# Patient Record
Sex: Female | Born: 2000 | Race: Black or African American | Hispanic: No | Marital: Single | Smoking: Former smoker
Health system: Southern US, Community
[De-identification: ages and names within clinical notes are randomized; demographics above are authoritative.]

## PROBLEM LIST (undated history)

## (undated) ENCOUNTER — Emergency Department (HOSPITAL_COMMUNITY): Payer: Medicaid Other | Source: Home / Self Care

## (undated) ENCOUNTER — Inpatient Hospital Stay (HOSPITAL_COMMUNITY): Payer: Self-pay

## (undated) DIAGNOSIS — F32A Depression, unspecified: Secondary | ICD-10-CM

## (undated) DIAGNOSIS — E669 Obesity, unspecified: Secondary | ICD-10-CM

## (undated) DIAGNOSIS — I10 Essential (primary) hypertension: Secondary | ICD-10-CM

## (undated) HISTORY — DX: Obesity, unspecified: E66.9

## (undated) HISTORY — DX: Essential (primary) hypertension: I10

## (undated) HISTORY — PX: MYRINGOTOMY: SUR874

## (undated) HISTORY — DX: Depression, unspecified: F32.A

---
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
## 2022-01-08 ENCOUNTER — Encounter: Payer: Self-pay | Admitting: Nurse Practitioner

## 2022-01-08 ENCOUNTER — Ambulatory Visit (INDEPENDENT_AMBULATORY_CARE_PROVIDER_SITE_OTHER): Payer: 59 | Admitting: Nurse Practitioner

## 2022-01-08 ENCOUNTER — Other Ambulatory Visit (HOSPITAL_COMMUNITY)
Admission: RE | Admit: 2022-01-08 | Discharge: 2022-01-08 | Disposition: A | Discharge status: Home / Self Care | Payer: Medicaid Other | Source: Ambulatory Visit | Attending: Nurse Practitioner | Admitting: Nurse Practitioner

## 2022-01-08 VITALS — BP 128/78 | HR 88 | Ht 64.0 in | Wt 310.0 lb

## 2022-01-08 DIAGNOSIS — N3944 Nocturnal enuresis: Secondary | ICD-10-CM | POA: Diagnosis not present

## 2022-01-08 DIAGNOSIS — B372 Candidiasis of skin and nail: Secondary | ICD-10-CM

## 2022-01-08 DIAGNOSIS — N760 Acute vaginitis: Secondary | ICD-10-CM | POA: Diagnosis not present

## 2022-01-08 DIAGNOSIS — Z6841 Body Mass Index (BMI) 40.0 and over, adult: Secondary | ICD-10-CM

## 2022-01-08 HISTORY — DX: Candidiasis of skin and nail: B37.2

## 2022-01-08 MED ORDER — PHENTERMINE HCL 15 MG PO CAPS: 15.0000 mg | ORAL_CAPSULE | ORAL | 0 refills | Status: DC

## 2022-01-08 MED ORDER — DESMOPRESSIN ACETATE 0.2 MG PO TABS: 0.2000 mg | ORAL_TABLET | Freq: Every day | ORAL | 0 refills | Status: DC

## 2022-01-08 MED ORDER — NYSTATIN 100000 UNIT/GM EX POWD: 1.0000 | Freq: Three times a day (TID) | CUTANEOUS | 0 refills | Status: DC

## 2022-01-08 NOTE — Patient Instructions (Addendum)
Please apply nystatin powder 3 times daily to the areas under your breasts.  Keep site clean and dry always  Please take desmopressin 0.2 mg once daily at bedtime for nocturnal enuresis.  Limit fluid intake to 1 hour before taking the medication.    Please take phentermine 15 mg by mouth daily for weight loss management  It is important that you exercise regularly at least 30 minutes 5 times a week.  Think about what you will eat, plan ahead. Choose " clean, green, fresh or frozen" over canned, processed or packaged foods which are more sugary, salty and fatty. 70 to 75% of food eaten should be vegetables and fruit. Three meals at set times with snacks allowed between meals, but they must be fruit or vegetables. Aim to eat over a 12 hour period , example 7 am to 7 pm, and STOP after  your last meal of the day. Drink water,generally about 64 ounces per day, no other drink is as healthy. Fruit juice is best enjoyed in a healthy way, by EATING the fruit.  Thanks for choosing [facility], we consider it a privelige to serve you.

## 2022-01-08 NOTE — Assessment & Plan Note (Signed)
Wt Readings from Last 3 Encounters:  01/08/22 (!) 310 lb (140.6 kg) (>99 %, Z= 2.87)*  10/28/21 (!) 320 lb (145.2 kg) (>99 %, Z= 2.90)*  09/16/21 (!) 321 lb (145.6 kg) (>99 %, Z= 2.89)*   * Growth percentiles are based on CDC (Girls, 2-20 Years) data.  She does a lot of walking on the job interested in starting weight loss medication. Start phentermine 15 mg daily Patient counseled on low-carb diet patient encouraged to engage in regular vigorous exercises at least 150 minutes weekly importance of portion control discussed. Side effect of phentermine including palpitation hypertension discussed with patient. Follow-up in 4 weeks

## 2022-01-08 NOTE — Assessment & Plan Note (Signed)
Start desmopressin 0.2 mg daily at bedtime Side effects of medications reviewed with patient Patient encouraged to set up alarms to wake up at night to use the bathroom and avoid drinking water late in the night

## 2022-01-08 NOTE — Assessment & Plan Note (Signed)
Urine cytology ordered

## 2022-01-08 NOTE — Progress Notes (Signed)
   Sonya Collins     MRN: [id number]      DOB: Jul 16, 2000   HPI [name] with past medical history of obesity, nocturnal enuresis, anxiety and depression is here for follow-up for nocturnal enuresis.  Patient stated that she has stopped drinking lots of water at night but she still continues to wet the bed.    Vaginitis.  Patient complains of whitish vaginal discharge with itching that occurs each time after her monthly.  This she takes Azo as needed.  She denies fever, chills, abdominal pain, malaise.  She is currently not sexually active.   Candidiasis intertrigo has itchy rashes under her breast since the poast one month, site is red and burning. Marland Kitchen     ROS Denies recent fever or chills. Denies sinus pressure, nasal congestion, ear pain or sore throat. Denies chest congestion, productive cough or wheezing. Denies chest pains, palpitations and leg swelling Denies abdominal pain, nausea, vomiting,diarrhea or constipation.   Denies dysuria, frequency, hesitancy or incontinence. Denies joint pain, swelling and limitation in mobility. Denies headaches, seizures, numbness, or tingling. Denies depression, anxiety or insomnia.    PE  BP 128/78 (BP Location: Right Arm, Patient Position: Sitting, Cuff Size: Large)   Pulse 88   Ht 5\' 4"  (1.626 m)   Wt (!) 310 lb (140.6 kg)   LMP 01/03/2022 (Exact Date)   SpO2 97%   BMI 53.21 kg/m   Patient alert and oriented and in no cardiopulmonary distress.  Chest: Clear to auscultation bilaterally.  CVS: S1, S2 no murmurs, no S3.Regular rate.  ABD: Soft non tender.   Ext: No edema  MS: Adequate ROM spine, shoulders, hips and knees.  Skin: Skin under bilateral breast moist and red.  No drainage or swelling noted  Psych: Good eye contact, normal affect. Memory intact not anxious or depressed appearing.  CNS: CN 2-12 intact, power,  normal throughout.no focal deficits noted.   Assessment & Plan  Obesity Wt Readings from Last 3  Encounters:  01/08/22 (!) 310 lb (140.6 kg) (>99 %, Z= 2.87)*  10/28/21 (!) 320 lb (145.2 kg) (>99 %, Z= 2.90)*  09/16/21 (!) 321 lb (145.6 kg) (>99 %, Z= 2.89)*   * Growth percentiles are based on CDC (Girls, 2-20 Years) data.  She does a lot of walking on the job interested in starting weight loss medication. Start phentermine 15 mg daily Patient counseled on low-carb diet patient encouraged to engage in regular vigorous exercises at least 150 minutes weekly importance of portion control discussed. Side effect of phentermine including palpitation hypertension discussed with patient. Follow-up in 4 weeks  Nocturnal enuresis Start desmopressin 0.2 mg daily at bedtime Side effects of medications reviewed with patient Patient encouraged to set up alarms to wake up at night to use the bathroom and avoid drinking water late in the night  Candidiasis, intertrigo Keep areas under bilateral breast clean and dry Apply nystatin powder 3 times daily  Acute vaginitis Urine cytology ordered

## 2022-01-08 NOTE — Assessment & Plan Note (Signed)
Keep areas under bilateral breast clean and dry Apply nystatin powder 3 times daily

## 2022-01-12 LAB — URINE CYTOLOGY ANCILLARY ONLY
Candida Urine: NEGATIVE
Chlamydia: NEGATIVE
Comment: NEGATIVE
Comment: NEGATIVE
Comment: NORMAL
Neisseria Gonorrhea: NEGATIVE
Trichomonas: POSITIVE — AB

## 2022-01-13 ENCOUNTER — Other Ambulatory Visit: Payer: Self-pay | Admitting: Nurse Practitioner

## 2022-01-13 DIAGNOSIS — A599 Trichomoniasis, unspecified: Secondary | ICD-10-CM

## 2022-01-13 MED ORDER — METRONIDAZOLE 500 MG PO TABS
500.0000 mg | ORAL_TABLET | Freq: Two times a day (BID) | ORAL | 0 refills | Status: AC
Start: 2022-01-13 — End: 2022-01-20

## 2022-01-13 NOTE — Progress Notes (Signed)
Has trichomonas infection patient should take flagyl  500 mg twice daily for 7 days.  Patient should not consume alcohol during the course of treatment and for 24 hours after the last dose.    Patient should advise her sexual partners to also get tested and treated

## 2022-01-30 ENCOUNTER — Emergency Department (HOSPITAL_COMMUNITY)
Admission: EM | Admit: 2022-01-30 | Discharge: 2022-01-30 | Disposition: A | Discharge status: Home / Self Care | Payer: Medicaid Other | Attending: Emergency Medicine | Admitting: Emergency Medicine

## 2022-01-30 ENCOUNTER — Encounter (HOSPITAL_COMMUNITY): Payer: Self-pay | Admitting: Emergency Medicine

## 2022-01-30 ENCOUNTER — Other Ambulatory Visit: Payer: Self-pay

## 2022-01-30 DIAGNOSIS — Z3200 Encounter for pregnancy test, result unknown: Secondary | ICD-10-CM | POA: Diagnosis present

## 2022-01-30 DIAGNOSIS — Z3202 Encounter for pregnancy test, result negative: Secondary | ICD-10-CM | POA: Diagnosis not present

## 2022-01-30 LAB — POC URINE PREG, ED: Preg Test, Ur: NEGATIVE

## 2022-01-30 NOTE — ED Triage Notes (Signed)
Pt presents for pregnancy test, took home test, positive test?

## 2022-01-30 NOTE — Discharge Instructions (Addendum)
Your pregnancy test today is negative. Recommend repeat home test in 1 week if you have not started your period. If your test is positive, follow up with OB.

## 2022-01-30 NOTE — ED Provider Notes (Signed)
[facility] EMERGENCY DEPARTMENT Provider Note   CSN: [id number] Arrival date & time: 01/30/22  1435     History  Chief Complaint  Patient presents with   Possible Pregnancy    Sonya Collins is a 21 y.o. female.  21 year old female presents with request for pregnancy test.  Reports LMP 12/31/2021, has had several home positive tests and would like confirmation.  She denies abdominal pain, vaginal bleeding.  No other complaints or concerns.       Home Medications Prior to Admission medications   Medication Sig Start Date End Date Taking? Authorizing Provider  desmopressin (DDAVP) 0.2 MG tablet Take 1 tablet (0.2 mg total) by mouth at bedtime. 01/08/22   Donell Beers, FNP  FLUoxetine (PROZAC) 20 MG capsule Take 1 capsule (20 mg total) by mouth daily. Patient not taking: Reported on 01/08/2022 11/18/21   Donell Beers, FNP  Iron, Ferrous Sulfate, 325 (65 Fe) MG TABS Take 325 mg by mouth daily. 10/30/21   Paseda, Baird Kay, FNP  Norethindrone Acetate-Ethinyl Estrad-FE (HAILEY 24 FE) 1-20 MG-MCG(24) tablet Take 1 tablet by mouth daily. Patient not taking: Reported on 09/16/2021 05/06/21   Brock Bad, MD  nystatin (MYCOSTATIN/NYSTOP) powder Apply 1 Application topically 3 (three) times daily. 01/08/22   Donell Beers, FNP  phentermine 15 MG capsule Take 1 capsule (15 mg total) by mouth every morning. 01/08/22   Paseda, Baird Kay, FNP  Vitamin D, Ergocalciferol, (DRISDOL) 1.25 MG (50000 UNIT) CAPS capsule TAKE 1 CAPSULE BY MOUTH EVERY 7 DAYS Patient not taking: Reported on 01/08/2022 11/25/21   Donell Beers, FNP      Allergies    Kiwi extract    Review of Systems   Review of Systems Negative except as per HPI Physical Exam Updated Vital Signs BP 114/67 (BP Location: Right Arm)   Pulse 76   Temp 98 F (36.7 C) (Oral)   Resp 16   Ht 5\' 4"  (1.626 m)   Wt (!) 139.7 kg   LMP 01/03/2022 (Exact Date)   SpO2 100%   BMI 52.87 kg/m  Physical Exam Vitals  and nursing note reviewed.  Constitutional:      General: She is not in acute distress.    Appearance: She is well-developed. She is not diaphoretic.  HENT:     Head: Normocephalic and atraumatic.  Pulmonary:     Effort: Pulmonary effort is normal.  Abdominal:     Palpations: Abdomen is soft.     Tenderness: There is no abdominal tenderness. There is no right CVA tenderness or left CVA tenderness.  Skin:    General: Skin is warm and dry.  Neurological:     Mental Status: She is alert and oriented to person, place, and time.  Psychiatric:        Behavior: Behavior normal.     ED Results / Procedures / Treatments   Labs (all labs ordered are listed, but only abnormal results are displayed) Labs Reviewed  POC URINE PREG, ED - Normal    EKG None  Radiology No results found.  Procedures Procedures    Medications Ordered in ED Medications - No data to display  ED Course/ Medical Decision Making/ A&P                           Medical Decision Making  21 year old female with request for pregnancy test after positive home test, LMP 1 month ago. No complaints  otherwise. Abdomen is soft and non tender. Urine test is negative. Recommend repeat home test in 1 week if she has not started her cycle and if positive, can follow up with OB.         Final Clinical Impression(s) / ED Diagnoses Final diagnoses:  Negative pregnancy test    Rx / DC Orders ED Discharge Orders     None         Jeannie Fend, PA-C 01/30/22 1519    Eber Hong, MD 01/31/22 1116

## 2022-02-06 ENCOUNTER — Encounter: Payer: Self-pay | Admitting: Nurse Practitioner

## 2022-02-06 ENCOUNTER — Ambulatory Visit: Payer: 59 | Admitting: Nurse Practitioner

## 2022-03-21 ENCOUNTER — Emergency Department (HOSPITAL_COMMUNITY)
Admission: EM | Admit: 2022-03-21 | Discharge: 2022-03-21 | Disposition: A | Discharge status: Home / Self Care | Payer: Medicaid Other | Attending: Emergency Medicine | Admitting: Emergency Medicine

## 2022-03-21 ENCOUNTER — Encounter (HOSPITAL_COMMUNITY): Payer: Self-pay

## 2022-03-21 ENCOUNTER — Other Ambulatory Visit: Payer: Self-pay

## 2022-03-21 DIAGNOSIS — Z3202 Encounter for pregnancy test, result negative: Secondary | ICD-10-CM | POA: Diagnosis present

## 2022-03-21 DIAGNOSIS — Z711 Person with feared health complaint in whom no diagnosis is made: Secondary | ICD-10-CM

## 2022-03-21 LAB — URINALYSIS, ROUTINE W REFLEX MICROSCOPIC
Bilirubin Urine: NEGATIVE
Glucose, UA: NEGATIVE mg/dL
Hgb urine dipstick: NEGATIVE
Ketones, ur: NEGATIVE mg/dL
Nitrite: NEGATIVE
Protein, ur: NEGATIVE mg/dL
Specific Gravity, Urine: 1.014 (ref 1.005–1.030)
pH: 6 (ref 5.0–8.0)

## 2022-03-21 LAB — PREGNANCY, URINE: Preg Test, Ur: NEGATIVE

## 2022-03-21 NOTE — ED Provider Notes (Signed)
Emergency Department Provider Note   I have reviewed the triage vital signs and the nursing notes.   HISTORY  Chief Complaint wants pregnancy test via bloodwork   HPI Sonya Collins is a 21 y.o. female presents to the ED with slightly delayed menses, cramping, and urine frequency. She is specifically concerned for pregnancy and is requesting a test. She has taken multiple tests at home which have all been negative. No abdominal pain. No fever or chills. No flank pain. No vaginal bleeding or discharge. She was last tested for STI last month, per patient, and all were negative. She has been sexually active with two partners without condoms. She is not on contraception.    Past Medical History:  Diagnosis Date   Depression    Obesity     Review of Systems  Constitutional: No fever/chills Eyes: No visual changes. ENT: No sore throat. Cardiovascular: Denies chest pain. Respiratory: Denies shortness of breath. Gastrointestinal: No abdominal pain.  No nausea, no vomiting.  No diarrhea.  No constipation. Genitourinary: Negative for dysuria. Positive cramping pain. Positive urine frequency.  Musculoskeletal: Negative for back pain. Skin: Negative for rash. Neurological: Negative for headaches, focal weakness or numbness.  ____________________________________________   PHYSICAL EXAM:  VITAL SIGNS: ED Triage Vitals [03/21/22 2025]  Enc Vitals Group     BP 132/69     Pulse Rate 86     Resp 20     Temp 98.3 F (36.8 C)     Temp src      SpO2 99 %   Constitutional: Alert and oriented. Well appearing and in no acute distress. Eyes: Conjunctivae are normal. Head: Atraumatic. Nose: No congestion/rhinnorhea. Mouth/Throat: Mucous membranes are moist.   Neck: No stridor.   Cardiovascular: Normal rate, regular rhythm. Good peripheral circulation. Grossly normal heart sounds.   Respiratory: Normal respiratory effort.  No retractions. Lungs CTAB. Gastrointestinal: Soft and  nontender. No distention.  Musculoskeletal: No lower extremity tenderness nor edema. No gross deformities of extremities. Neurologic:  Normal speech and language. No gross focal neurologic deficits are appreciated.  Skin:  Skin is warm, dry and intact. No rash noted.  ____________________________________________   LABS (all labs ordered are listed, but only abnormal results are displayed)  Labs Reviewed  URINALYSIS, ROUTINE W REFLEX MICROSCOPIC - Abnormal; Notable for the following components:      Result Value   APPearance HAZY (*)    Leukocytes,Ua TRACE (*)    Bacteria, UA RARE (*)    All other components within normal limits  PREGNANCY, URINE   ____________________________________________   PROCEDURES  Procedure(s) performed:   Procedures  None ____________________________________________   INITIAL IMPRESSION / ASSESSMENT AND PLAN / ED COURSE  Pertinent labs & imaging results that were available during my care of the patient were reviewed by me and considered in my medical decision making (see chart for details).   This patient is Presenting for Evaluation of cramping pain, which does require a range of treatment options, and is a complaint that involves a high risk of morbidity and mortality.  The Differential Diagnoses includes but is not exclusive to ectopic pregnancy, ovarian cyst, ovarian torsion, acute appendicitis, urinary tract infection, endometriosis, bowel obstruction, hernia, colitis, renal colic, gastroenteritis, volvulus etc.   I decided to review pertinent External Data, and in summary patient seen in this ED on 8/4 with similar presentation.    Clinical Laboratory Tests Ordered, included urine pregnancy which is negative. UA without clear UTI.   Social Determinants of Health  Risk patient is a smoker.   Medical Decision Making: Summary:  Patient presents to the with concern for pregnancy. She was seen last month for similar presentation. Recent STI  testing. No discharge. Doubt PID/TOA based on history/exam. Patient declines pelvic exam. Urine pregnancy negative. Had a in depth conversation with patient regarding OCPs, condom use, etc to prevent pregnancy, if that is her wish. Also discussed STI risk in unprotected sex. Patient not interested in starting OCPs today. Gave contact information on AVS for local OB/Gyn practice for follow up to discuss contraception options vs preparing to conceive, if that is her wish.   Disposition: discharge  ____________________________________________  FINAL CLINICAL IMPRESSION(S) / ED DIAGNOSES  Final diagnoses:  Concern about current pregnancy without diagnosis    Note:  This document was prepared using Dragon voice recognition software and may include unintentional dictation errors.  Nanda Quinton, MD, [facility] Emergency Medicine    Randeep Biondolillo, Wonda Olds, MD 03/22/22 [phone]

## 2022-03-21 NOTE — ED Notes (Signed)
Patient verbalizes understanding of discharge instructions. Opportunity for questioning and answers were provided. Armband removed by staff, pt discharged from ED. Ambulated out to lobby  

## 2022-03-21 NOTE — Discharge Instructions (Signed)
You were seen in the emergency room today with concern for pregnancy.  Her pregnancy test is negative.  I have listed the name of an OB/GYN on this paperwork to call on Monday for a close follow-up appointment.  They can discuss family-planning options and perform regular GYN screenings.

## 2022-03-21 NOTE — ED Notes (Signed)
ED Provider at bedside. 

## 2022-03-21 NOTE — ED Triage Notes (Signed)
Pt to ED requesting blood work to see if she is pregnant. Pt reports last menstrual cycle was Aug 6-9, pt says she took home urne test earlier this week and it was negative, pt reports last intercourse was sept 11 and she didn't use protection. Pt reports nausea and abd cramping for several weeks, frequent urination symptoms started this week.

## 2022-05-08 ENCOUNTER — Encounter: Payer: Self-pay | Admitting: Family Medicine

## 2022-05-08 ENCOUNTER — Telehealth: Payer: Self-pay | Admitting: Family Medicine

## 2022-05-08 ENCOUNTER — Ambulatory Visit (INDEPENDENT_AMBULATORY_CARE_PROVIDER_SITE_OTHER): Payer: Commercial Managed Care - PPO | Admitting: Family Medicine

## 2022-05-08 VITALS — BP 124/86 | HR 88 | Ht 64.0 in | Wt 314.0 lb

## 2022-05-08 DIAGNOSIS — H9209 Otalgia, unspecified ear: Secondary | ICD-10-CM | POA: Insufficient documentation

## 2022-05-08 DIAGNOSIS — H9202 Otalgia, left ear: Secondary | ICD-10-CM

## 2022-05-08 DIAGNOSIS — A749 Chlamydial infection, unspecified: Secondary | ICD-10-CM | POA: Diagnosis not present

## 2022-05-08 DIAGNOSIS — Z113 Encounter for screening for infections with a predominantly sexual mode of transmission: Secondary | ICD-10-CM

## 2022-05-08 MED ORDER — DOXYCYCLINE HYCLATE 100 MG PO TABS: 100.0000 mg | ORAL_TABLET | Freq: Two times a day (BID) | ORAL | 0 refills | Status: DC

## 2022-05-08 MED ORDER — DOXYCYCLINE HYCLATE 100 MG PO TABS: 100.0000 mg | ORAL_TABLET | Freq: Two times a day (BID) | ORAL | 0 refills | Status: AC

## 2022-05-08 NOTE — Addendum Note (Signed)
Addended byGilmore Laroche on: 05/08/2022 05:02 PM   Modules accepted: Orders

## 2022-05-08 NOTE — Assessment & Plan Note (Signed)
Normal tympanic membranes bilaterally No signs of infection were noted on physical examination Denies pain with palpation of the left and right ear Her right ear is impacted; she declines ear irrigation at this time She reports that she is getting over a cold, which is likely causing her ear pain

## 2022-05-08 NOTE — Telephone Encounter (Signed)
Pt is wanting to know why she was given doxycycline?

## 2022-05-08 NOTE — Patient Instructions (Signed)
I appreciate the opportunity to provide care to you today!    Follow up:  3 months  Please pick up your medication at the pharmacy and complete treatment    Please continue to a heart-healthy diet and increase your physical activities. Try to exercise for at least three times a week.      It was a pleasure to see you and I look forward to continuing to work together on your health and well-being. Please do not hesitate to call the office if you need care or have questions about your care.   Have a wonderful day and week. With Gratitude, Gilmore Laroche MSN, FNP-BC

## 2022-05-08 NOTE — Progress Notes (Signed)
Established Patient Office Visit  Subjective:  Patient ID: Sonya Collins, female    DOB: 2001-04-19  Age: 21 y.o. MRN: [id number]  CC:  Chief Complaint  Patient presents with   STD screening    Would like to have std testing.   Ear Pain    Pt report ear fullness since 05/05/2022    HPI Sonya Collins is a 21 y.o. female with past medical history of unprotected sexual intercourse, presents for f/u of  chronic medical conditions.  STD exposure: She reports that her partner tested positive for chlamydia 2 weeks ago, and she would like to be tested today.  She is asymptomatic, denying symptoms of dysuria and abnormal vaginal discharge.  She complains of left ear pain, reporting that her ear has felt full since 05/05/2022.  She denies discharge, odor, and fever.  Past Medical History:  Diagnosis Date   Depression    Obesity     Past Surgical History:  Procedure Laterality Date   MYRINGOTOMY      Family History  Problem Relation Age of Onset   Hypertension Father    Diabetes Father    Schizophrenia Brother    Breast cancer Other    Breast cancer Other     Social History   Socioeconomic History   Marital status: Single    Spouse name: Not on file   Number of children: Not on file   Years of education: Not on file   Highest education level: Not on file  Occupational History   Not on file  Tobacco Use   Smoking status: Every Day    Types: E-cigarettes   Smokeless tobacco: Never  Vaping Use   Vaping Use: Every day  Substance and Sexual Activity   Alcohol use: Not Currently    Comment: on weekends   Drug use: Yes    Types: Marijuana    Comment: daily   Sexual activity: Yes    Partners: Male    Birth control/protection: None  Other Topics Concern   Not on file  Social History Narrative   Lives with her grandparents, works at [employer] office.    Social Determinants of Health   Financial Resource Strain: Not on file  Food Insecurity: Not on file   Transportation Needs: Not on file  Physical Activity: Not on file  Stress: Not on file  Social Connections: Not on file  Intimate Partner Violence: Not on file    Outpatient Medications Prior to Visit  Medication Sig Dispense Refill   desmopressin (DDAVP) 0.2 MG tablet Take 1 tablet (0.2 mg total) by mouth at bedtime. (Patient not taking: Reported on 05/08/2022) 90 tablet 0   FLUoxetine (PROZAC) 20 MG capsule Take 1 capsule (20 mg total) by mouth daily. (Patient not taking: Reported on 01/08/2022) 90 capsule 3   Iron, Ferrous Sulfate, 325 (65 Fe) MG TABS Take 325 mg by mouth daily. (Patient not taking: Reported on 05/08/2022) 90 tablet 0   Norethindrone Acetate-Ethinyl Estrad-FE (HAILEY 24 FE) 1-20 MG-MCG(24) tablet Take 1 tablet by mouth daily. (Patient not taking: Reported on 09/16/2021) 28 tablet 11   nystatin (MYCOSTATIN/NYSTOP) powder Apply 1 Application topically 3 (three) times daily. (Patient not taking: Reported on 05/08/2022) 15 g 0   phentermine 15 MG capsule Take 1 capsule (15 mg total) by mouth every morning. (Patient not taking: Reported on 05/08/2022) 30 capsule 0   Vitamin D, Ergocalciferol, (DRISDOL) 1.25 MG (50000 UNIT) CAPS capsule TAKE 1 CAPSULE BY MOUTH EVERY 7 DAYS (  Patient not taking: Reported on 01/08/2022) 8 capsule 0   No facility-administered medications prior to visit.    Allergies  Allergen Reactions   Kiwi Extract     ROS Review of Systems  Constitutional:  Negative for fatigue and fever.  HENT:  Positive for ear pain. Negative for ear discharge.   Respiratory:  Negative for chest tightness and shortness of breath.   Cardiovascular:  Negative for chest pain and palpitations.  Gastrointestinal:  Negative for diarrhea and vomiting.  Neurological:  Negative for dizziness and headaches.      Objective:    Physical Exam HENT:     Head: Normocephalic.     Right Ear: Hearing, tympanic membrane and ear canal normal. There is impacted cerumen. Tympanic  membrane is not injected, perforated or erythematous.     Left Ear: Hearing, tympanic membrane and ear canal normal. There is no impacted cerumen. Tympanic membrane is not injected, perforated or erythematous.     Nose: No congestion.  Cardiovascular:     Rate and Rhythm: Normal rate and regular rhythm.     Heart sounds: No murmur heard. Pulmonary:     Effort: Pulmonary effort is normal.     Breath sounds: Normal breath sounds.  Musculoskeletal:     Cervical back: No rigidity.  Skin:    Findings: No bruising.  Neurological:     Mental Status: She is alert.     BP 124/86   Pulse 88   Ht _0  (1.626 m)   Wt (!) 314 lb (142.4 kg)   LMP 04/21/2022   SpO2 97%   BMI 53.90 kg/m  Wt Readings from Last 3 Encounters:  05/08/22 (!) 314 lb (142.4 kg) (>99 %, Z= 2.93)*  01/30/22 (!) 308 lb (139.7 kg) (>99 %, Z= 2.87)*  01/08/22 (!) 310 lb (140.6 kg) (>99 %, Z= 2.87)*   * Growth percentiles are based on CDC (Girls, 2-20 Years) data.    Lab Results  Component Value Date   TSH 2.710 09/16/2021   Lab Results  Component Value Date   WBC 4.7 09/16/2021   HGB 12.1 09/16/2021   HCT 38.1 09/16/2021   MCV 78 (L) 09/16/2021   PLT 363 09/16/2021   Lab Results  Component Value Date   NA 140 09/16/2021   K 4.2 09/16/2021   CO2 24 09/16/2021   GLUCOSE 86 09/16/2021   BUN 8 09/16/2021   CREATININE 0.59 09/16/2021   BILITOT 0.4 09/16/2021   ALKPHOS 78 09/16/2021   AST 15 09/16/2021   ALT 16 09/16/2021   PROT 6.6 09/16/2021   ALBUMIN 3.7 (L) 09/16/2021   CALCIUM 9.4 09/16/2021   EGFR 133 09/16/2021   Lab Results  Component Value Date   CHOL 165 09/16/2021   Lab Results  Component Value Date   HDL 52 09/16/2021   Lab Results  Component Value Date   LDLCALC 88 09/16/2021   Lab Results  Component Value Date   TRIG 144 (H) 09/16/2021   Lab Results  Component Value Date   CHOLHDL 3.2 09/16/2021   Lab Results  Component Value Date   HGBA1C 5.7 (H) 09/16/2021       Assessment & Plan:   Problem List Items Addressed This Visit       Other   Screening examination for STD (sexually transmitted disease) - Primary    Given her recent exposure, the patient will be treated prophylactically with doxycycline 100 mg twice daily for 5 days for chlamydia Encourage the  patient to complete the full course of antibiotic Pending NuSwab      Relevant Orders   NuSwab Vaginitis Plus (VG+)   Ear pain    Normal tympanic membranes bilaterally No signs of infection were noted on physical examination Denies pain with palpation of the left and right ear Her right ear is impacted; she declines ear irrigation at this time She reports that she is getting over a cold, which is likely causing her ear pain      Other Visit Diagnoses     Chlamydia       Relevant Medications   doxycycline (VIBRA-TABS) 100 MG tablet       Meds ordered this encounter  Medications   doxycycline (VIBRA-TABS) 100 MG tablet    Sig: Take 1 tablet (100 mg total) by mouth 2 (two) times daily for 5 days.    Dispense:  10 tablet    Refill:  0    Follow-up: Return in about 3 months (around 08/08/2022).    Alvira Monday, FNP

## 2022-05-08 NOTE — Assessment & Plan Note (Addendum)
Given her recent exposure, the patient will be treated prophylactically with doxycycline 100 mg twice daily for 5 days for chlamydia Encourage the patient to complete the full course of antibiotic Pending NuSwab

## 2022-05-08 NOTE — Telephone Encounter (Signed)
Pt informed

## 2022-05-13 NOTE — Progress Notes (Signed)
Please inform the patient that she tested negative for trichomoniasis, gonorrhea, BV and yeast infection

## 2022-05-14 ENCOUNTER — Other Ambulatory Visit: Payer: Self-pay | Admitting: Family Medicine

## 2022-05-14 ENCOUNTER — Telehealth: Payer: Self-pay | Admitting: Family Medicine

## 2022-05-14 LAB — NUSWAB VAGINITIS PLUS (VG+)
Candida albicans, NAA: NEGATIVE
Candida glabrata, NAA: NEGATIVE
Chlamydia trachomatis, NAA: POSITIVE — AB
Neisseria gonorrhoeae, NAA: NEGATIVE
Trich vag by NAA: NEGATIVE

## 2022-05-14 NOTE — Progress Notes (Signed)
Please inform the patient that she tested positive for chlamydia, and to continue her treatment with doxycycline 100 mg twice daily

## 2022-05-14 NOTE — Telephone Encounter (Signed)
Patient sent mycart message for lab results. Patient wants a call.

## 2022-05-14 NOTE — Telephone Encounter (Signed)
Mychart message sent with lab results.

## 2022-05-14 NOTE — Telephone Encounter (Signed)
Pt called no answer no vm

## 2022-05-24 ENCOUNTER — Telehealth: Payer: Medicaid Other | Admitting: Nurse Practitioner

## 2022-05-24 ENCOUNTER — Encounter: Payer: Self-pay | Admitting: Family Medicine

## 2022-05-24 DIAGNOSIS — B3731 Acute candidiasis of vulva and vagina: Secondary | ICD-10-CM | POA: Diagnosis not present

## 2022-05-24 MED ORDER — FLUCONAZOLE 150 MG PO TABS: 150.0000 mg | ORAL_TABLET | Freq: Once | ORAL | 0 refills | Status: AC

## 2022-05-24 NOTE — Progress Notes (Signed)

## 2022-05-24 NOTE — Progress Notes (Signed)
I have spent 5 minutes in review of e-visit questionnaire, review and updating patient chart, medical decision making and response to patient.  ° °Sonya Collins W Kirsty Monjaraz, NP ° °  °

## 2022-05-25 ENCOUNTER — Other Ambulatory Visit: Payer: Self-pay

## 2022-05-25 ENCOUNTER — Other Ambulatory Visit: Payer: Self-pay | Admitting: Family Medicine

## 2022-05-25 DIAGNOSIS — B379 Candidiasis, unspecified: Secondary | ICD-10-CM

## 2022-05-25 DIAGNOSIS — N3944 Nocturnal enuresis: Secondary | ICD-10-CM

## 2022-05-25 MED ORDER — FLUCONAZOLE 150 MG PO TABS: 150.0000 mg | ORAL_TABLET | Freq: Once | ORAL | 0 refills | Status: AC

## 2022-05-25 MED ORDER — DESMOPRESSIN ACETATE 0.2 MG PO TABS: 0.2000 mg | ORAL_TABLET | Freq: Every day | ORAL | 0 refills | Status: DC

## 2022-07-06 ENCOUNTER — Ambulatory Visit (INDEPENDENT_AMBULATORY_CARE_PROVIDER_SITE_OTHER): Payer: Medicaid Other | Admitting: Internal Medicine

## 2022-07-06 ENCOUNTER — Encounter: Payer: Self-pay | Admitting: Internal Medicine

## 2022-07-06 VITALS — BP 112/71 | HR 81 | Resp 16 | Ht 64.0 in | Wt 317.0 lb

## 2022-07-06 DIAGNOSIS — R7303 Prediabetes: Secondary | ICD-10-CM

## 2022-07-06 DIAGNOSIS — D509 Iron deficiency anemia, unspecified: Secondary | ICD-10-CM | POA: Diagnosis not present

## 2022-07-06 DIAGNOSIS — N912 Amenorrhea, unspecified: Secondary | ICD-10-CM

## 2022-07-06 LAB — POCT URINE PREGNANCY: Preg Test, Ur: NEGATIVE

## 2022-07-06 NOTE — Patient Instructions (Addendum)
Thank you, Ms.[name] for allowing [place] to provide your care today.   I have ordered the following labs for you:  Lab Orders         hCG, quantitative, pregnancy         Hemoglobin A1C         Ferritin         CBC with Differential/Platelet       Referrals ordered today:   Referral Orders         Ambulatory referral to diabetic education         Amb Referral To Provider Referral Exercise Program (P.R.E.P)       Reminders: Regular physical activity is one of the most important things you can do for your health. If you're ready to get the immediate benefits of better sleep, reduced anxiety, and lower blood pressure, here are ways to get started. Look for ways to reduce time sitting and increase time moving. For example, make it a tradition to walk before or after dinner. I recommend at least 150 minutes of moderate-intensity physical activity a week for adults. You might split that into 30 minutes, 5 days a week.  I will follow up with results of lab work.      Tamsen Snider, M.D.

## 2022-07-06 NOTE — Progress Notes (Unsigned)
   HPI:[name] is a 22 y.o. female who presents for evaluation of possible pregnancy. For the details of today's visit, please refer to the assessment and plan.  Physical Exam: Vitals:   07/06/22 1100  BP: 112/71  Pulse: 81  Resp: 16  SpO2: 97%  Weight: (!) 317 lb (143.8 kg)  Height: 5\' 4"  (1.626 m)     Physical Exam Constitutional:      General: She is not in acute distress.    Appearance: She is well-developed and well-groomed. She is obese.  Eyes:     General: No scleral icterus.    Conjunctiva/sclera: Conjunctivae normal.  Cardiovascular:     Rate and Rhythm: Normal rate and regular rhythm.     Heart sounds: No murmur heard.    No friction rub. No gallop.  Pulmonary:     Effort: Pulmonary effort is normal.     Breath sounds: No wheezing, rhonchi or rales.  Musculoskeletal:     Right lower leg: No edema.     Left lower leg: No edema.  Skin:    General: Skin is warm and dry.      Assessment & Plan:   Iron deficiency anemia Patient not currently on iron supplement. Hx of IDA, likely secondary to menstrual cycle. - CBC, Ferritin  Addendum: No longer anemic, Ferritin <100. Recommended increasing intake of foods high in iron.   Amenorrhea Patient has regular menstrual cycles and has missed her last menstrual cycle. Her last menstrual cycle was December 2nd. She is not on BC. Had unprotected sex recently and inconclusive home pregnancy test. POC urine pregnancy test negative. Patient request further testing today. - Beta HCG, Quant  Morbid obesity (Sonya Collins) BMI 54.41. Prediabetic.  - Counseled on risk of obesity  - Referral to RD  - Referral To Provider Referral Exercise Program (P.R.E.P)   Prediabetes Borderline prediabetic at last check.  - Hemoglobin A1c - Referral to RD to discuss diet     Lorene Dy, MD

## 2022-07-07 DIAGNOSIS — R7303 Prediabetes: Secondary | ICD-10-CM | POA: Insufficient documentation

## 2022-07-07 DIAGNOSIS — N912 Amenorrhea, unspecified: Secondary | ICD-10-CM | POA: Insufficient documentation

## 2022-07-07 LAB — CBC WITH DIFFERENTIAL/PLATELET
Basophils Absolute: 0 10*3/uL (ref 0.0–0.2)
Basos: 0 %
EOS (ABSOLUTE): 0.1 10*3/uL (ref 0.0–0.4)
Eos: 2 %
Hematocrit: 39.3 % (ref 34.0–46.6)
Hemoglobin: 12.8 g/dL (ref 11.1–15.9)
Immature Grans (Abs): 0 10*3/uL (ref 0.0–0.1)
Immature Granulocytes: 0 %
Lymphocytes Absolute: 1.1 10*3/uL (ref 0.7–3.1)
Lymphs: 31 %
MCH: 26.8 pg (ref 26.6–33.0)
MCHC: 32.6 g/dL (ref 31.5–35.7)
MCV: 82 fL (ref 79–97)
Monocytes Absolute: 0.3 10*3/uL (ref 0.1–0.9)
Monocytes: 8 %
Neutrophils Absolute: 2 10*3/uL (ref 1.4–7.0)
Neutrophils: 59 %
Platelets: 369 10*3/uL (ref 150–450)
RBC: 4.77 x10E6/uL (ref 3.77–5.28)
RDW: 13.2 % (ref 11.7–15.4)
WBC: 3.4 10*3/uL (ref 3.4–10.8)

## 2022-07-07 LAB — BETA HCG QUANT (REF LAB): hCG Quant: <1 m[IU]/mL

## 2022-07-07 LAB — HEMOGLOBIN A1C
Est. average glucose Bld gHb Est-mCnc: 114 mg/dL
Hgb A1c MFr Bld: 5.6 % (ref 4.8–5.6)

## 2022-07-07 LAB — FERRITIN: Ferritin: 28 ng/mL (ref 15–77)

## 2022-07-07 NOTE — Assessment & Plan Note (Addendum)
Patient has regular menstrual cycles and has missed her last menstrual cycle. Her last menstrual cycle was December 2nd. She is not on BC. Had unprotected sex recently and inconclusive home pregnancy test. POC urine pregnancy test negative. Patient request further testing today. - Beta HCG, Quant

## 2022-07-07 NOTE — Assessment & Plan Note (Signed)
BMI 54.41. Prediabetic.  - Counseled on risk of obesity  - Referral to RD  - Referral To Provider Referral Exercise Program (P.R.E.P)

## 2022-07-07 NOTE — Assessment & Plan Note (Signed)
Borderline prediabetic at last check.  - Hemoglobin A1c - Referral to RD to discuss diet

## 2022-07-07 NOTE — Assessment & Plan Note (Signed)
Patient not currently on iron supplement. Hx of IDA, likely secondary to menstrual cycle. - CBC, Ferritin  Addendum: No longer anemic, Ferritin <100. Recommended increasing intake of foods high in iron.

## 2022-08-05 ENCOUNTER — Telehealth: Payer: Self-pay | Admitting: Nutrition

## 2022-08-05 ENCOUNTER — Ambulatory Visit: Payer: Medicaid Other | Admitting: Nutrition

## 2022-08-05 NOTE — Telephone Encounter (Signed)
Vm to call and r/s missed appt.

## 2022-08-14 ENCOUNTER — Ambulatory Visit: Payer: 59 | Admitting: Family Medicine

## 2022-08-17 ENCOUNTER — Encounter: Payer: Self-pay | Admitting: Family Medicine

## 2022-08-18 ENCOUNTER — Ambulatory Visit: Payer: Medicaid Other | Admitting: Nutrition

## 2022-09-16 ENCOUNTER — Ambulatory Visit: Payer: Medicaid Other | Admitting: Nutrition

## 2022-09-17 ENCOUNTER — Ambulatory Visit: Payer: Medicaid Other | Admitting: Nurse Practitioner

## 2022-09-18 ENCOUNTER — Encounter: Payer: Medicaid Other | Admitting: Family Medicine

## 2022-09-29 ENCOUNTER — Encounter: Payer: Self-pay | Admitting: Emergency Medicine

## 2022-09-29 ENCOUNTER — Other Ambulatory Visit: Payer: Self-pay

## 2022-09-29 ENCOUNTER — Ambulatory Visit
Admission: EM | Admit: 2022-09-29 | Discharge: 2022-09-29 | Disposition: A | Discharge status: Home / Self Care | Payer: Medicaid Other | Attending: Family Medicine | Admitting: Family Medicine

## 2022-09-29 DIAGNOSIS — N926 Irregular menstruation, unspecified: Secondary | ICD-10-CM | POA: Diagnosis not present

## 2022-09-29 DIAGNOSIS — R110 Nausea: Secondary | ICD-10-CM

## 2022-09-29 DIAGNOSIS — Z3202 Encounter for pregnancy test, result negative: Secondary | ICD-10-CM

## 2022-09-29 LAB — POCT URINALYSIS DIP (MANUAL ENTRY)
Bilirubin, UA: NEGATIVE
Blood, UA: NEGATIVE
Glucose, UA: NEGATIVE mg/dL
Ketones, POC UA: NEGATIVE mg/dL
Leukocytes, UA: NEGATIVE
Nitrite, UA: NEGATIVE
Protein Ur, POC: NEGATIVE mg/dL
Spec Grav, UA: >=1.03 — AB (ref 1.010–1.025)
Urobilinogen, UA: 2 E.U./dL — AB
pH, UA: 6 (ref 5.0–8.0)

## 2022-09-29 LAB — POCT URINE PREGNANCY: Preg Test, Ur: NEGATIVE

## 2022-09-29 NOTE — ED Provider Notes (Signed)
[facility]    CSN: [id number] Arrival date & time: 09/29/22  1903      History   Chief Complaint Chief Complaint  Patient presents with   Abdominal Pain    HPI Sonya Collins is a 22 y.o. female.   Presenting today with about a week of nausea intermittent throughout the day.  She states she missed her period a week or so ago so took a pregnancy test this morning at home with a faint pink line.  She wanted to double check by having a pregnancy test done here.  Denies vaginal bleeding, vaginal discharge, abdominal pain, flank pain, fever, chills.    Past Medical History:  Diagnosis Date   Depression    Obesity     Patient Active Problem List   Diagnosis Date Noted   Prediabetes 07/07/2022   Amenorrhea 07/07/2022   Screening examination for STD (sexually transmitted disease) 05/08/2022   Ear pain 05/08/2022   Candidiasis, intertrigo 01/08/2022   Acute vaginitis 01/08/2022   Anxiety and depression 10/28/2021   Iron deficiency anemia 10/28/2021   Tremor 10/28/2021   Nocturnal enuresis 10/28/2021   Vapes nicotine containing substance 10/28/2021   Unprotected sexual intercourse 10/28/2021   Intolerance to cold 10/28/2021   Morbid obesity 09/16/2021   Screening due 09/16/2021   Breakthrough bleeding 09/16/2021   Breakthrough bleeding on Nexplanon 02/07/2020   Nexplanon in place 02/06/2020    Past Surgical History:  Procedure Laterality Date   MYRINGOTOMY      OB History     Gravida  0   Para  0   Term  0   Preterm  0   AB  0   Living  0      SAB  0   IAB  0   Ectopic  0   Multiple  0   Live Births  0            Home Medications    Prior to Admission medications   Medication Sig Start Date End Date Taking? Authorizing Provider  desmopressin (DDAVP) 0.2 MG tablet Take 1 tablet (0.2 mg total) by mouth at bedtime. 05/25/22   Alvira Monday, FNP  FLUoxetine (PROZAC) 10 MG tablet Take 10 mg by mouth daily.    [provider]    Family History Family History  Problem Relation Age of Onset   Hypertension Father    Diabetes Father    Schizophrenia Brother    Breast cancer Other    Breast cancer Other     Social History Social History   Tobacco Use   Smoking status: Every Day    Types: E-cigarettes   Smokeless tobacco: Never  Vaping Use   Vaping Use: Every day  Substance Use Topics   Alcohol use: Not Currently    Comment: on weekends   Drug use: Yes    Types: Marijuana    Comment: occ     Allergies   Kiwi extract   Review of Systems Review of Systems Per HPI  Physical Exam Triage Vital Signs ED Triage Vitals  Enc Vitals Group     BP 09/29/22 1922 110/78     Pulse Rate 09/29/22 1922 81     Resp 09/29/22 1922 20     Temp 09/29/22 1922 98.8 F (37.1 C)     Temp Source 09/29/22 1922 Oral     SpO2 09/29/22 1922 94 %     Weight --      Height --  Head Circumference --      Peak Flow --      Pain Score 09/29/22 1918 0     Pain Loc --      Pain Edu? --      Excl. in [place]? --    No data found.  Updated Vital Signs BP 110/78 (BP Location: Right Arm)   Pulse 81   Temp 98.8 F (37.1 C) (Oral)   Resp 20   LMP 08/25/2022 (Approximate)   SpO2 94%   Visual Acuity Right Eye Distance:   Left Eye Distance:   Bilateral Distance:    Right Eye Near:   Left Eye Near:    Bilateral Near:     Physical Exam Vitals and nursing note reviewed.  Constitutional:      Appearance: Normal appearance. She is not ill-appearing.  HENT:     Head: Atraumatic.  Eyes:     Extraocular Movements: Extraocular movements intact.     Conjunctiva/sclera: Conjunctivae normal.  Cardiovascular:     Rate and Rhythm: Normal rate and regular rhythm.     Heart sounds: Normal heart sounds.  Pulmonary:     Effort: Pulmonary effort is normal.     Breath sounds: Normal breath sounds.  Abdominal:     General: Bowel sounds are normal. There is no distension.     Palpations: Abdomen is soft.      Tenderness: There is no abdominal tenderness. There is no guarding.  Genitourinary:    Comments: GU exam deferred Musculoskeletal:        General: Normal range of motion.     Cervical back: Normal range of motion and neck supple.  Skin:    General: Skin is warm and dry.  Neurological:     Mental Status: She is alert and oriented to person, place, and time.  Psychiatric:        Mood and Affect: Mood normal.        Thought Content: Thought content normal.        Judgment: Judgment normal.      UC Treatments / Results  Labs (all labs ordered are listed, but only abnormal results are displayed) Labs Reviewed  POCT URINALYSIS DIP (MANUAL ENTRY) - Abnormal; Notable for the following components:      Result Value   Spec Grav, UA >=1.030 (*)    Urobilinogen, UA 2.0 (*)    All other components within normal limits  POCT URINE PREGNANCY    EKG   Radiology No results found.  Procedures Procedures (including critical care time)  Medications Ordered in UC Medications - No data to display  Initial Impression / Assessment and Plan / UC Course  I have reviewed the triage vital signs and the nursing notes.  Pertinent labs & imaging results that were available during my care of the patient were reviewed by me and considered in my medical decision making (see chart for details).     Vitals and exam reassuring, urine pregnancy negative.  Offered blood work for reassurance but she plans to just take another home test tomorrow to confirm.  OB/GYN resources given.  Declines nausea medication or further evaluation today.  Final Clinical Impressions(s) / UC Diagnoses   Final diagnoses:  Negative pregnancy test  Missed period  Nausea without vomiting     Discharge Instructions      Your pregnancy test was negative today.  I have provided OB/GYN information, you may call and schedule an appointment to discuss your irregular periods and  the symptoms that you are  having.    ED Prescriptions   None    PDMP not reviewed this encounter.   Volney American, [place] 09/29/22 1956

## 2022-09-29 NOTE — ED Triage Notes (Addendum)
Pt reports abdominal pain, nausea x1.5 week. Reports home pregnancy was "faint pink line". LMP 2/27.Inquiring about HCG levels.

## 2022-09-29 NOTE — Discharge Instructions (Signed)
Your pregnancy test was negative today.  I have provided OB/GYN information, you may call and schedule an appointment to discuss your irregular periods and the symptoms that you are having.

## 2023-02-13 ENCOUNTER — Encounter (HOSPITAL_COMMUNITY): Payer: Self-pay

## 2023-02-13 ENCOUNTER — Inpatient Hospital Stay (HOSPITAL_COMMUNITY)
Admission: AD | Admit: 2023-02-13 | Discharge: 2023-02-13 | Disposition: A | Discharge status: Home / Self Care | Payer: Medicaid Other | Attending: Obstetrics and Gynecology | Admitting: Obstetrics and Gynecology

## 2023-02-13 ENCOUNTER — Inpatient Hospital Stay (HOSPITAL_COMMUNITY): Payer: Medicaid Other

## 2023-02-13 DIAGNOSIS — N760 Acute vaginitis: Secondary | ICD-10-CM

## 2023-02-13 DIAGNOSIS — B9689 Other specified bacterial agents as the cause of diseases classified elsewhere: Secondary | ICD-10-CM | POA: Diagnosis not present

## 2023-02-13 DIAGNOSIS — F17290 Nicotine dependence, other tobacco product, uncomplicated: Secondary | ICD-10-CM | POA: Diagnosis not present

## 2023-02-13 DIAGNOSIS — O2391 Unspecified genitourinary tract infection in pregnancy, first trimester: Secondary | ICD-10-CM | POA: Diagnosis not present

## 2023-02-13 DIAGNOSIS — O3680X0 Pregnancy with inconclusive fetal viability, not applicable or unspecified: Secondary | ICD-10-CM | POA: Diagnosis not present

## 2023-02-13 DIAGNOSIS — O99331 Smoking (tobacco) complicating pregnancy, first trimester: Secondary | ICD-10-CM | POA: Insufficient documentation

## 2023-02-13 DIAGNOSIS — Z3A01 Less than 8 weeks gestation of pregnancy: Secondary | ICD-10-CM | POA: Diagnosis not present

## 2023-02-13 DIAGNOSIS — R102 Pelvic and perineal pain: Secondary | ICD-10-CM | POA: Diagnosis present

## 2023-02-13 LAB — CBC
HCT: 37.1 % (ref 36.0–46.0)
Hemoglobin: 12 g/dL (ref 12.0–15.0)
MCH: 26.6 pg (ref 26.0–34.0)
MCHC: 32.3 g/dL (ref 30.0–36.0)
MCV: 82.3 fL (ref 80.0–100.0)
Platelets: 363 10*3/uL (ref 150–400)
RBC: 4.51 MIL/uL (ref 3.87–5.11)
RDW: 14.1 % (ref 11.5–15.5)
WBC: 5.8 10*3/uL (ref 4.0–10.5)
nRBC: 0 % (ref 0.0–0.2)

## 2023-02-13 LAB — WET PREP, GENITAL
Sperm: NONE SEEN
Trich, Wet Prep: NONE SEEN
WBC, Wet Prep HPF POC: >=10 — AB (ref <10)
Yeast Wet Prep HPF POC: NONE SEEN

## 2023-02-13 LAB — URINALYSIS, ROUTINE W REFLEX MICROSCOPIC
Bilirubin Urine: NEGATIVE
Glucose, UA: NEGATIVE mg/dL
Hgb urine dipstick: NEGATIVE
Ketones, ur: NEGATIVE mg/dL
Leukocytes,Ua: NEGATIVE
Nitrite: NEGATIVE
Protein, ur: NEGATIVE mg/dL
Specific Gravity, Urine: 1.029 (ref 1.005–1.030)
pH: 5 (ref 5.0–8.0)

## 2023-02-13 LAB — TYPE AND SCREEN
ABO/RH(D): A POS
Antibody Screen: NEGATIVE

## 2023-02-13 LAB — POCT PREGNANCY, URINE: Preg Test, Ur: POSITIVE — AB

## 2023-02-13 LAB — HCG, QUANTITATIVE, PREGNANCY: hCG, Beta Chain, Quant, S: 416 m[IU]/mL — ABNORMAL HIGH (ref <5)

## 2023-02-13 MED ORDER — METRONIDAZOLE 500 MG PO TABS: 500.0000 mg | ORAL_TABLET | Freq: Two times a day (BID) | ORAL | 0 refills | Status: DC

## 2023-02-13 NOTE — Discharge Instructions (Signed)
-   Please come back to the MAU on Monday afternoon/evening when you get off of work. We'll do a repeat blood draw for the pregnancy hormone at that time - You will also get a call next week about scheduling an ultrasound - Please take the antibiotics twice per day for the next 7 days

## 2023-02-13 NOTE — MAU Provider Note (Signed)
Chief Complaint: Abdominal Cramping  SUBJECTIVE HPI: Sonya Collins is a 22 y.o. G1P0000 at [redacted]w[redacted]d by LMP who presents to maternity admissions reporting abdominal cramping.  Pt notes having irregular periods and is really uncertain on how far along she is. Not a planned pregnancy but is planning on continuing it. Noting increased vaginal discharge. Denies bleeding/passing of tissue. Was not on any contraception. Would like to establish prenatal care.   HPI  Past Medical History:  Diagnosis Date   Depression    Obesity    Past Surgical History:  Procedure Laterality Date   MYRINGOTOMY     Social History   Socioeconomic History   Marital status: Single    Spouse name: Not on file   Number of children: Not on file   Years of education: Not on file   Highest education level: Not on file  Occupational History   Not on file  Tobacco Use   Smoking status: Every Day    Types: E-cigarettes   Smokeless tobacco: Never  Vaping Use   Vaping status: Every Day  Substance and Sexual Activity   Alcohol use: Not Currently    Comment: on weekends   Drug use: Yes    Types: Marijuana    Comment: occ   Sexual activity: Yes    Partners: Male    Birth control/protection: None  Other Topics Concern   Not on file  Social History Narrative   Lives with her grandparents, works at [employer] office.    Social Determinants of Health   Financial Resource Strain: Not on file  Food Insecurity: Not on file  Transportation Needs: Not on file  Physical Activity: Not on file  Stress: Not on file  Social Connections: Not on file  Intimate Partner Violence: Not on file   No current facility-administered medications on file prior to encounter.   Current Outpatient Medications on File Prior to Encounter  Medication Sig Dispense Refill   desmopressin (DDAVP) 0.2 MG tablet Take 1 tablet (0.2 mg total) by mouth at bedtime. 90 tablet 0   FLUoxetine (PROZAC) 10 MG tablet Take 10 mg by mouth daily.      Allergies  Allergen Reactions   Kiwi Extract     ROS:  Pertinent positives/negatives listed above.  I have reviewed patient's Past Medical Hx, Surgical Hx, Family Hx, Social Hx, medications and allergies.   Physical Exam  Patient Vitals for the past 24 hrs:  BP Temp Temp src Pulse Resp SpO2  02/13/23 1515 122/62 98.8 F (37.1 C) Oral 70 14 100 %   Constitutional: Well-developed, well-nourished female in no acute distress.  Cardiovascular: normal rate Respiratory: normal effort GI: Abd soft, non-tender. Pos BS x 4 MS: Extremities nontender, no edema, normal ROM Neurologic: Alert and oriented x 4.   LAB RESULTS Results for orders placed or performed during the hospital encounter of 02/13/23 (from the past 24 hour(s))  Pregnancy, urine POC     Status: Abnormal   Collection Time: 02/13/23  3:03 PM  Result Value Ref Range   Preg Test, Ur POSITIVE (A) NEGATIVE  Urinalysis, Routine w reflex microscopic -Urine, Clean Catch     Status: Abnormal   Collection Time: 02/13/23  3:23 PM  Result Value Ref Range   Color, Urine YELLOW YELLOW   APPearance HAZY (A) CLEAR   Specific Gravity, Urine 1.029 1.005 - 1.030   pH 5.0 5.0 - 8.0   Glucose, UA NEGATIVE NEGATIVE mg/dL   Hgb urine dipstick NEGATIVE NEGATIVE  Bilirubin Urine NEGATIVE NEGATIVE   Ketones, ur NEGATIVE NEGATIVE mg/dL   Protein, ur NEGATIVE NEGATIVE mg/dL   Nitrite NEGATIVE NEGATIVE   Leukocytes,Ua NEGATIVE NEGATIVE   RBC / HPF 0-5 0 - 5 RBC/hpf   WBC, UA 0-5 0 - 5 WBC/hpf   Bacteria, UA RARE (A) NONE SEEN   Squamous Epithelial / HPF 0-5 0 - 5 /HPF   Mucus PRESENT   Wet prep, genital     Status: Abnormal   Collection Time: 02/13/23  3:27 PM   Specimen: PATH Cytology Cervicovaginal Ancillary Only  Result Value Ref Range   Yeast Wet Prep HPF POC NONE SEEN NONE SEEN   Trich, Wet Prep NONE SEEN NONE SEEN   Clue Cells Wet Prep HPF POC PRESENT (A) NONE SEEN   WBC, Wet Prep HPF POC >=10 (A) <10   Sperm NONE SEEN    Type and screen [facility]     Status: None   Collection Time: 02/13/23  3:41 PM  Result Value Ref Range   ABO/RH(D) A POS    Antibody Screen NEG    Sample Expiration      02/16/2023,2359 Performed at [facility], [address] [place], [place] [address]   CBC     Status: None   Collection Time: 02/13/23  3:47 PM  Result Value Ref Range   WBC 5.8 4.0 - 10.5 K/uL   RBC 4.51 3.87 - 5.11 MIL/uL   Hemoglobin 12.0 12.0 - 15.0 g/dL   HCT 60.4 54.0 - 98.1 %   MCV 82.3 80.0 - 100.0 fL   MCH 26.6 26.0 - 34.0 pg   MCHC 32.3 30.0 - 36.0 g/dL   RDW 19.1 47.8 - 29.5 %   Platelets 363 150 - 400 K/uL   nRBC 0.0 0.0 - 0.2 %  hCG, quantitative, pregnancy     Status: Abnormal   Collection Time: 02/13/23  3:47 PM  Result Value Ref Range   hCG, Beta Chain, Quant, S 416 (H) <5 mIU/mL    --/--/A POS (08/17 1541)  IMAGING US OB LESS THAN 14 WEEKS WITH OB TRANSVAGINAL  Result Date: 02/13/2023 CLINICAL DATA:  Cramping. The patient's beta HCG is 416. Gestational age by last menstrual period is 4 weeks and 6 days. EXAM: OBSTETRIC <14 WK [place] AND TRANSVAGINAL OB US TECHNIQUE: Both transabdominal and transvaginal ultrasound examinations were performed for complete evaluation of the gestation as well as the maternal uterus, adnexal regions, and pelvic cul-de-sac. Transvaginal technique was performed to assess early pregnancy. COMPARISON:  None Available. FINDINGS: Intrauterine gestational sac: None Yolk sac:  Not Visualized. Embryo:  Not Visualized. Cardiac Activity: Not Visualized. Subchorionic hemorrhage:  None visualized. Maternal uterus/adnexae: No adnexal mass is noted. The endometrium measures 11 mm in thickness. IMPRESSION: No intrauterine gestational sac or embryo is identified. Recommend follow-up quantitative B-HCG levels and follow-up US in 14 days to assess viability. Electronically Signed   By: Romona Curls M.D.   On: 02/13/2023 18:02    MAU Management/MDM: Orders  Placed This Encounter  Procedures   Wet prep, genital   US OB LESS THAN 14 WEEKS WITH OB TRANSVAGINAL   Urinalysis, Routine w reflex microscopic -Urine, Clean Catch   CBC   hCG, quantitative, pregnancy   Pregnancy, urine POC   Type and screen [facility]   Discharge patient Discharge disposition: 01-Home or Self Care; Discharge patient date: 02/13/2023    Meds ordered this encounter  Medications   metroNIDAZOLE (  FLAGYL) 500 MG tablet    Sig: Take 1 tablet (500 mg total) by mouth 2 (two) times daily.    Dispense:  14 tablet    Refill:  0    Given pt's cramping with unconfirmed IUP, PUL work-up obtained. Given low HCG and no gestational sac on U/S, in setting of unknown LMP, difficult to determine if patient is very newly pregnant (1-2 weeks), is having a miscarriage, or has an ectopic. Discussed this with patient along with instructions to return for repeat HCG on Monday. Due to patient's work schedule, she will obtain repeat HCG at MAU, as she cannot make it to clinic. She will also need a repeat U/S in 10-14 days to assess for viability. I have sent a message to scheduling to get this appointment.  Treat with Flagyl for BV.  ASSESSMENT 1. Pregnancy of unknown anatomic location   2. Bacterial vaginosis     PLAN Discharge home with strict return precautions for ectopic pregnancy. Allergies as of 02/13/2023       Reactions   Kiwi Extract         Medication List     TAKE these medications    desmopressin 0.2 MG tablet Commonly known as: DDAVP Take 1 tablet (0.2 mg total) by mouth at bedtime.   FLUoxetine 10 MG tablet Commonly known as: PROZAC Take 10 mg by mouth daily.   metroNIDAZOLE 500 MG tablet Commonly known as: FLAGYL Take 1 tablet (500 mg total) by mouth 2 (two) times daily.         Wylene Simmer, MD OB Fellow 02/13/2023  7:33 PM

## 2023-02-13 NOTE — MAU Note (Signed)
.  Sonya Collins is a 22 y.o. at [redacted]w[redacted]d here in MAU reporting: has had lower abdominal cramping for the last 2 weeks. Just found out she was pregnant on Friday and is worried. Denies VB or abnormal discharge.  LMP: either June 14 or July 14, patient unsure Pain score: 5 Vitals:   02/13/23 1515  BP: 122/62  Pulse: 70  Resp: 14  Temp: 98.8 F (37.1 C)  SpO2: 100%      Lab orders placed from triage:  UPT, UA

## 2023-02-15 ENCOUNTER — Inpatient Hospital Stay (HOSPITAL_COMMUNITY)
Admission: AD | Admit: 2023-02-15 | Discharge: 2023-02-15 | Disposition: A | Discharge status: Home / Self Care | Payer: Medicaid Other | Attending: Obstetrics and Gynecology | Admitting: Obstetrics and Gynecology

## 2023-02-15 DIAGNOSIS — O26891 Other specified pregnancy related conditions, first trimester: Secondary | ICD-10-CM | POA: Diagnosis not present

## 2023-02-15 DIAGNOSIS — Z3A01 Less than 8 weeks gestation of pregnancy: Secondary | ICD-10-CM | POA: Diagnosis not present

## 2023-02-15 DIAGNOSIS — O3680X0 Pregnancy with inconclusive fetal viability, not applicable or unspecified: Secondary | ICD-10-CM | POA: Diagnosis not present

## 2023-02-15 LAB — HCG, QUANTITATIVE, PREGNANCY: hCG, Beta Chain, Quant, S: 1233 m[IU]/mL — ABNORMAL HIGH (ref <5)

## 2023-02-15 LAB — GC/CHLAMYDIA PROBE AMP (~~LOC~~) NOT AT ARMC
Chlamydia: NEGATIVE
Comment: NEGATIVE
Comment: NORMAL
Neisseria Gonorrhea: NEGATIVE

## 2023-02-15 MED ORDER — PRENATAL VITAMIN PLUS LOW IRON 27-1 MG PO TABS: ORAL_TABLET | ORAL | 6 refills | Status: DC

## 2023-02-15 NOTE — MAU Provider Note (Signed)
None     Chief Complaint:  Labs Only   Sonya Collins is  22 y.o. G1P0000 at [redacted]w[redacted]d presents complaining of Labs Only . Seen in MAU on 02/13/23 for cramping. At that visit, HCG was 416, [place] did not reveal pregnancy location.  Here for F/U HCG.  Cramping is now less.  Vaginal bleeding none   Obstetrical/Gynecological History: OB History     Gravida  1   Para  0   Term  0   Preterm  0   AB  0   Living  0      SAB  0   IAB  0   Ectopic  0   Multiple  0   Live Births  0          Past Medical History: Past Medical History:  Diagnosis Date   Depression    Obesity     Past Surgical History: Past Surgical History:  Procedure Laterality Date   MYRINGOTOMY      Family History: Family History  Problem Relation Age of Onset   Hypertension Father    Diabetes Father    Schizophrenia Brother    Breast cancer Other    Breast cancer Other     Social History: Social History   Tobacco Use   Smoking status: Every Day    Types: E-cigarettes   Smokeless tobacco: Never  Vaping Use   Vaping status: Every Day  Substance Use Topics   Alcohol use: Not Currently    Comment: on weekends   Drug use: Yes    Types: Marijuana    Comment: occ    Allergies:  Allergies  Allergen Reactions   Kiwi Extract     Meds:  Medications Prior to Admission  Medication Sig Dispense Refill Last Dose   desmopressin (DDAVP) 0.2 MG tablet Take 1 tablet (0.2 mg total) by mouth at bedtime. 90 tablet 0    FLUoxetine (PROZAC) 10 MG tablet Take 10 mg by mouth daily.      metroNIDAZOLE (FLAGYL) 500 MG tablet Take 1 tablet (500 mg total) by mouth 2 (two) times daily. 14 tablet 0     Review of Systems   Constitutional: Negative for fever and chills Eyes: Negative for visual disturbances Respiratory: Negative for shortness of breath, dyspnea Cardiovascular: Negative for chest pain or palpitations  Gastrointestinal: Negative for vomiting, diarrhea and constipation Genitourinary:  Negative for dysuria and urgency Musculoskeletal: Negative for back pain, joint pain, myalgias.  Normal ROM  Neurological: Negative for dizziness and headaches    Physical Exam  Blood pressure (!) 130/51, pulse 68, temperature 98.7 F (37.1 C), temperature source Oral, resp. rate 16, height 5\' 4"  (1.626 m), weight (!) 155.1 kg, last menstrual period 01/10/2023, SpO2 100%. GENERAL: Well-developed, well-nourished female in no acute distress.  LUNGS: Normal respiratory effort HEART: Regular rate and rhythm. ABDOMEN: Soft, nontender, EXTREMITIES: Nontender, no edema, 2+ distal pulses. DTR's 2+ Labs: Results for orders placed or performed during the hospital encounter of 02/15/23 (from the past 24 hour(s))  hCG, quantitative, pregnancy   Collection Time: 02/15/23  7:18 PM  Result Value Ref Range   hCG, Beta Chain, Quant, S 1,233 (H) <5 mIU/mL   Imaging Studies:  US OB LESS THAN 14 WEEKS WITH OB TRANSVAGINAL  Result Date: 02/13/2023 CLINICAL DATA:  Cramping. The patient's beta HCG is 416. Gestational age by last menstrual period is 4 weeks and 6 days. EXAM: OBSTETRIC <14 WK [place] AND TRANSVAGINAL OB US TECHNIQUE: Both transabdominal  and transvaginal ultrasound examinations were performed for complete evaluation of the gestation as well as the maternal uterus, adnexal regions, and pelvic cul-de-sac. Transvaginal technique was performed to assess early pregnancy. COMPARISON:  None Available. FINDINGS: Intrauterine gestational sac: None Yolk sac:  Not Visualized. Embryo:  Not Visualized. Cardiac Activity: Not Visualized. Subchorionic hemorrhage:  None visualized. Maternal uterus/adnexae: No adnexal mass is noted. The endometrium measures 11 mm in thickness. IMPRESSION: No intrauterine gestational sac or embryo is identified. Recommend follow-up quantitative B-HCG levels and follow-up US in 14 days to assess viability. Electronically Signed   By: Romona Curls M.D.   On: 02/13/2023 18:02     Assessment: Sonya Collins is  22 y.o. G1P0000 at [redacted]w[redacted]d presents with normally rising HCG.  Plan: Keep appt for repeat US on 02/25/23  Jacklyn Shell 8/19/20248:43 PM

## 2023-02-15 NOTE — MAU Note (Signed)
..  Sonya Collins is a 22 y.o. at [redacted]w[redacted]d here in MAU reporting: here for repeat HCG blood draw.  Patient denies vaginal bleeding or abdominal pain.    Pain score: 0/10 Vitals:   02/15/23 2005  BP: (!) 130/51  Pulse: 68  Resp: 16  Temp: 98.7 F (37.1 C)  SpO2: 100%     FHT:n/a Lab orders placed from triage: HCG blood drawn

## 2023-02-23 ENCOUNTER — Other Ambulatory Visit: Payer: Self-pay

## 2023-02-23 DIAGNOSIS — Z3A01 Less than 8 weeks gestation of pregnancy: Secondary | ICD-10-CM

## 2023-02-25 ENCOUNTER — Other Ambulatory Visit: Payer: Medicaid Other

## 2023-02-26 ENCOUNTER — Ambulatory Visit (HOSPITAL_COMMUNITY)
Admission: RE | Admit: 2023-02-26 | Discharge: 2023-02-26 | Disposition: A | Discharge status: Home / Self Care | Payer: Medicaid Other | Source: Ambulatory Visit | Attending: Obstetrics and Gynecology | Admitting: Obstetrics and Gynecology

## 2023-02-26 DIAGNOSIS — Z3A01 Less than 8 weeks gestation of pregnancy: Secondary | ICD-10-CM | POA: Insufficient documentation

## 2023-02-26 DIAGNOSIS — Z3687 Encounter for antenatal screening for uncertain dates: Secondary | ICD-10-CM | POA: Diagnosis not present

## 2023-03-17 ENCOUNTER — Inpatient Hospital Stay (HOSPITAL_COMMUNITY)
Admission: AD | Admit: 2023-03-17 | Discharge: 2023-03-17 | Disposition: A | Discharge status: Home / Self Care | Payer: Medicaid Other | Attending: Obstetrics and Gynecology | Admitting: Obstetrics and Gynecology

## 2023-03-17 ENCOUNTER — Telehealth: Payer: Medicaid Other | Admitting: Physician Assistant

## 2023-03-17 DIAGNOSIS — Z79899 Other long term (current) drug therapy: Secondary | ICD-10-CM | POA: Diagnosis not present

## 2023-03-17 DIAGNOSIS — O98811 Other maternal infectious and parasitic diseases complicating pregnancy, first trimester: Secondary | ICD-10-CM | POA: Diagnosis not present

## 2023-03-17 DIAGNOSIS — O99331 Smoking (tobacco) complicating pregnancy, first trimester: Secondary | ICD-10-CM | POA: Diagnosis not present

## 2023-03-17 DIAGNOSIS — O23591 Infection of other part of genital tract in pregnancy, first trimester: Secondary | ICD-10-CM | POA: Insufficient documentation

## 2023-03-17 DIAGNOSIS — B3731 Acute candidiasis of vulva and vagina: Secondary | ICD-10-CM | POA: Diagnosis not present

## 2023-03-17 DIAGNOSIS — F17290 Nicotine dependence, other tobacco product, uncomplicated: Secondary | ICD-10-CM | POA: Diagnosis not present

## 2023-03-17 DIAGNOSIS — O99341 Other mental disorders complicating pregnancy, first trimester: Secondary | ICD-10-CM | POA: Insufficient documentation

## 2023-03-17 DIAGNOSIS — F32A Depression, unspecified: Secondary | ICD-10-CM | POA: Diagnosis not present

## 2023-03-17 DIAGNOSIS — N760 Acute vaginitis: Secondary | ICD-10-CM

## 2023-03-17 DIAGNOSIS — Z3A09 9 weeks gestation of pregnancy: Secondary | ICD-10-CM | POA: Insufficient documentation

## 2023-03-17 DIAGNOSIS — O99211 Obesity complicating pregnancy, first trimester: Secondary | ICD-10-CM | POA: Insufficient documentation

## 2023-03-17 LAB — WET PREP, GENITAL
Clue Cells Wet Prep HPF POC: NONE SEEN
Sperm: NONE SEEN
Trich, Wet Prep: NONE SEEN
WBC, Wet Prep HPF POC: >=10 — AB (ref <10)

## 2023-03-17 MED ORDER — FLUCONAZOLE 150 MG PO TABS: 150.0000 mg | ORAL_TABLET | Freq: Once | ORAL | 1 refills | Status: AC

## 2023-03-17 NOTE — MAU Note (Signed)
.  Sonya Collins is a 22 y.o. at [redacted]w[redacted]d here in MAU reporting: Concerns for yeast infection. She reports thick white vaginal discharge as well as vaginal itching for one week now. She reports she recently finished ABX's last month. Denies VB. Denies pain.  Onset of complaint: x1 week Pain score: Denies pain.    FHT: n/a Lab orders placed from triage: Wet Prep

## 2023-03-17 NOTE — Progress Notes (Signed)
For the safety of you and your child, I recommend a face to face office visit with a health care provider.  Many mothers need to take medicines during their pregnancy and while nursing.  Almost all medicines pass into the breast milk in small quantities.  Most are generally considered safe for a mother to take but some medicines must be avoided.  After reviewing your E-Visit request, I recommend that you consult your OB/GYN or pediatrician for medical advice in relation to your condition and prescription medications while pregnant or breastfeeding.  NOTE:  There will be NO CHARGE for this eVisit  If you are having a true medical emergency please call 911.    For an urgent face to face visit,  has six urgent care centers for your convenience:     [facility] at [facility] Directions [phone] [address] [place], [place] [address]    [facility]) Get Driving Directions [phone] [address] [place], [place] [address]  [facility] Center [facility]) Get Driving Directions [phone] [address] [place],  [place]  [address]  [facility] at [facility] Get Driving Directions [phone] [address] [place], [place] [address]   [facility] Urgent Care at [facility] Get Driving Directions  [phone] [address] [place], [place] [address]   [facility] at [facility] Directions [phone] [address], [address] [place], [place] [address]  Your MyChart E-visit questionnaire answers were reviewed by a board certified advanced clinical practitioner to complete your personal care plan based on your specific symptoms.  Thank you for using e-Visits.

## 2023-03-17 NOTE — MAU Provider Note (Signed)
History     [id number]  Arrival date and time: 03/17/23 1655    Chief Complaint  Patient presents with   Vaginal Discharge   Vaginal Itching     HPI Sonya Collins is a 22 y.o. at [redacted]w[redacted]d by [place], who presents for yeast infection.   Patient states she thinks she has a yeast infection Has been having chunky white discharge for the past week No vaginal bleeding or abdominal pain No other complaints  --/--/A POS (08/17 1541)  OB History     Gravida  1   Para  0   Term  0   Preterm  0   AB  0   Living  0      SAB  0   IAB  0   Ectopic  0   Multiple  0   Live Births  0           Past Medical History:  Diagnosis Date   Depression    Obesity     Past Surgical History:  Procedure Laterality Date   MYRINGOTOMY      Family History  Problem Relation Age of Onset   Hypertension Father    Diabetes Father    Schizophrenia Brother    Breast cancer Other    Breast cancer Other     Social History   Socioeconomic History   Marital status: Single    Spouse name: Not on file   Number of children: Not on file   Years of education: Not on file   Highest education level: Not on file  Occupational History   Not on file  Tobacco Use   Smoking status: Every Day    Types: E-cigarettes   Smokeless tobacco: Never  Vaping Use   Vaping status: Every Day  Substance and Sexual Activity   Alcohol use: Not Currently    Comment: on weekends   Drug use: Yes    Types: Marijuana    Comment: occ   Sexual activity: Yes    Partners: Male    Birth control/protection: None  Other Topics Concern   Not on file  Social History Narrative   Lives with her grandparents, works at [employer] office.    Social Determinants of Health   Financial Resource Strain: Not on file  Food Insecurity: Not on file  Transportation Needs: Not on file  Physical Activity: Not on file  Stress: Not on file  Social Connections: Not on file  Intimate Partner Violence: Not on file     Allergies  Allergen Reactions   Kiwi Extract     No current facility-administered medications on file prior to encounter.   Current Outpatient Medications on File Prior to Encounter  Medication Sig Dispense Refill   FLUoxetine (PROZAC) 10 MG tablet Take 10 mg by mouth daily.     metroNIDAZOLE (FLAGYL) 500 MG tablet Take 1 tablet (500 mg total) by mouth 2 (two) times daily. 14 tablet 0   Prenatal Vit-Fe Fumarate-FA (PRENATAL VITAMIN PLUS LOW IRON) 27-1 MG TABS 1 tablet PO daily 90 tablet 6     ROS Pertinent positives and negative per HPI, all others reviewed and negative  Physical Exam   BP 138/83 (BP Location: Right Arm)   Pulse 85   Temp 98.2 F (36.8 C) (Oral)   Resp 16   Ht 5\' 4"  (1.626 m)   Wt (!) 157.8 kg   LMP 01/10/2023   SpO2 99%   BMI 59.70 kg/m   Patient  Vitals for the past 24 hrs:  BP Temp Temp src Pulse Resp SpO2 Height Weight  03/17/23 1714 138/83 98.2 F (36.8 C) Oral 85 16 99 % 5\' 4"  (1.626 m) (!) 157.8 kg    Physical Exam Vitals reviewed.  Constitutional:      General: She is not in acute distress.    Appearance: She is well-developed. She is not diaphoretic.  Eyes:     General: No scleral icterus. Pulmonary:     Effort: Pulmonary effort is normal. No respiratory distress.  Skin:    General: Skin is warm and dry.  Neurological:     Mental Status: She is alert.     Coordination: Coordination normal.      Cervical Exam    Bedside Ultrasound Not performed.  My interpretation: n/a  Labs Results for orders placed or performed during the hospital encounter of 03/17/23 (from the past 24 hour(s))  Wet prep, genital     Status: Abnormal   Collection Time: 03/17/23  5:19 PM   Specimen: Vaginal  Result Value Ref Range   Yeast Wet Prep HPF POC PRESENT (A) NONE SEEN   Trich, Wet Prep NONE SEEN NONE SEEN   Clue Cells Wet Prep HPF POC NONE SEEN NONE SEEN   WBC, Wet Prep HPF POC >=10 (A) <10   Sperm NONE SEEN     Imaging No results  found.  MAU Course  Procedures Lab Orders         Wet prep, genital    Meds ordered this encounter  Medications   fluconazole (DIFLUCAN) 150 MG tablet    Sig: Take 1 tablet (150 mg total) by mouth once for 1 dose. Can take additional dose three days later if symptoms persist    Dispense:  1 tablet    Refill:  1   Imaging Orders  No imaging studies ordered today    MDM Moderate (Level 3-4)  Assessment and Plan  #Yeast vaginitis #[redacted] weeks gestation of pregnancy Rx sent for diflucan   Dispo: discharged to home in stable condition    Venora Maples, MD/MPH 03/17/23 6:23 PM  Allergies as of 03/17/2023       Reactions   Kiwi Extract         Medication List     STOP taking these medications    metroNIDAZOLE 500 MG tablet Commonly known as: FLAGYL       TAKE these medications    fluconazole 150 MG tablet Commonly known as: DIFLUCAN Take 1 tablet (150 mg total) by mouth once for 1 dose. Can take additional dose three days later if symptoms persist   FLUoxetine 10 MG tablet Commonly known as: PROZAC Take 10 mg by mouth daily.   Prenatal Vitamin Plus Low Iron 27-1 MG Tabs 1 tablet PO daily

## 2023-03-18 LAB — GC/CHLAMYDIA PROBE AMP (~~LOC~~) NOT AT ARMC
Chlamydia: NEGATIVE
Comment: NEGATIVE
Comment: NORMAL
Neisseria Gonorrhea: NEGATIVE

## 2023-03-30 ENCOUNTER — Inpatient Hospital Stay (HOSPITAL_COMMUNITY)
Admission: AD | Admit: 2023-03-30 | Discharge: 2023-03-30 | Disposition: A | Discharge status: Home / Self Care | Payer: Medicaid Other | Attending: Obstetrics & Gynecology | Admitting: Obstetrics & Gynecology

## 2023-03-30 DIAGNOSIS — Z3A11 11 weeks gestation of pregnancy: Secondary | ICD-10-CM | POA: Insufficient documentation

## 2023-03-30 DIAGNOSIS — J302 Other seasonal allergic rhinitis: Secondary | ICD-10-CM | POA: Diagnosis not present

## 2023-03-30 DIAGNOSIS — R0981 Nasal congestion: Secondary | ICD-10-CM

## 2023-03-30 DIAGNOSIS — Z1152 Encounter for screening for COVID-19: Secondary | ICD-10-CM | POA: Diagnosis not present

## 2023-03-30 DIAGNOSIS — O99511 Diseases of the respiratory system complicating pregnancy, first trimester: Secondary | ICD-10-CM | POA: Diagnosis present

## 2023-03-30 LAB — RESP PANEL BY RT-PCR (RSV, FLU A&B, COVID)  RVPGX2
Influenza A by PCR: NEGATIVE
Influenza B by PCR: NEGATIVE
Resp Syncytial Virus by PCR: NEGATIVE
SARS Coronavirus 2 by RT PCR: NEGATIVE

## 2023-03-30 NOTE — Progress Notes (Signed)
COVID, flu, RSV neg.

## 2023-03-30 NOTE — MAU Note (Signed)
.  Sonya Collins is a 22 y.o. at [redacted]w[redacted]d here in MAU reporting: Runny nose and sneezing since this past Saturday. Denies VB, vaginal discharge, and pain. Reports no one around her is sick.  Onset of complaint: This past Saturday Pain score: Denies pain.   FHT: unable to doppler FHT Lab orders placed from triage: Resp Panel

## 2023-03-30 NOTE — MAU Provider Note (Signed)
  History     CSN: [id number]  Arrival date and time: 03/30/23 1255   Event Date/Time   First Provider Initiated Contact with Patient 03/30/23 1327      Chief Complaint  Patient presents with   Allergic Rhinitis    sneezing   HPI Kayden Amend is a 22 y.o. G1P0000 at [redacted]w[redacted]d who presents with complaint of sneezing and congestion. Started 3 days ago. Usually reports "getting sick" around changing seasons. No fevers or chills. Eating and drinking normally. No abdominal pain, c/d, n/v. Denies dysuria.   Past Medical History:  Diagnosis Date   Depression    Obesity     Past Surgical History:  Procedure Laterality Date   MYRINGOTOMY      Family History  Problem Relation Age of Onset   Hypertension Father    Diabetes Father    Schizophrenia Brother    Breast cancer Other    Breast cancer Other     Social History   Tobacco Use   Smoking status: Every Day    Types: E-cigarettes   Smokeless tobacco: Never  Vaping Use   Vaping status: Every Day  Substance Use Topics   Alcohol use: Not Currently    Comment: on weekends   Drug use: Yes    Types: Marijuana    Comment: occ    Allergies:  Allergies  Allergen Reactions   Kiwi Extract     No medications prior to admission.   ROS reviewed and pertinent positives and negatives as documented in HPI.  Physical Exam   Blood pressure 121/81, pulse 95, temperature 97.9 F (36.6 C), temperature source Oral, resp. rate 16, height 5\' 4"  (1.626 m), weight (!) 154.7 kg, last menstrual period 01/10/2023, SpO2 99%.  Physical Exam Constitutional:      General: She is not in acute distress.    Appearance: Normal appearance. She is not ill-appearing.  HENT:     Head: Normocephalic and atraumatic.  Cardiovascular:     Rate and Rhythm: Normal rate.  Pulmonary:     Effort: Pulmonary effort is normal.     Breath sounds: Normal breath sounds.  Abdominal:     Palpations: Abdomen is soft.     Tenderness: There is no abdominal  tenderness. There is no guarding.  Musculoskeletal:        General: Normal range of motion.  Skin:    General: Skin is warm and dry.     Findings: No rash.  Neurological:     General: No focal deficit present.     Mental Status: She is alert and oriented to person, place, and time.   +FCA on BSUS  MAU Course  Procedures  MDM 22 y.o. G1P0000 at [redacted]w[redacted]d who presents for congestion and sneezing. Ongoing x 3 days. No other systemic symptoms. Has not tried any OTC meds. Well appearing on exam with stable vital signs. Will swab for flu, COVID, RSV. Given list of OTC meds to tx viral URI, also rec starting flonase for seasonal allergies. Stable for d/c.  Assessment and Plan  Seasonal allergies  Nasal congestion - Plan: Discharge patient Rec OTC Flonase daily Swabs collected, will call with results Stable for d/c.  Sundra Aland, MD OB Fellow, Faculty Practice [facility], Center for [facility]  03/30/2023, 2:51 PM

## 2023-03-30 NOTE — Discharge Instructions (Signed)
Colds/Coughs/Allergies: Benadryl (alcohol free) 25 mg every 6 hours as needed Breath right strips Claritin Cepacol throat lozenges Chloraseptic throat spray Cold-Eeze- up to three times per day Cough drops, alcohol free Flonase (by prescription only) Guaifenesin Mucinex Robitussin DM (plain only, alcohol free) Saline nasal spray/drops Sudafed (pseudoephedrine) & Actifed ** use only after [redacted] weeks gestation and if you do not have high blood pressure Tylenol Vicks Vaporub Zinc lozenges Zyrtec

## 2023-04-01 ENCOUNTER — Ambulatory Visit: Payer: Medicaid Other | Admitting: *Deleted

## 2023-04-01 ENCOUNTER — Other Ambulatory Visit (INDEPENDENT_AMBULATORY_CARE_PROVIDER_SITE_OTHER): Payer: Medicaid Other

## 2023-04-01 VITALS — BP 135/85 | HR 91 | Wt 342.0 lb

## 2023-04-01 DIAGNOSIS — Z3A11 11 weeks gestation of pregnancy: Secondary | ICD-10-CM | POA: Diagnosis not present

## 2023-04-01 DIAGNOSIS — Z3481 Encounter for supervision of other normal pregnancy, first trimester: Secondary | ICD-10-CM

## 2023-04-01 DIAGNOSIS — Z362 Encounter for other antenatal screening follow-up: Secondary | ICD-10-CM

## 2023-04-01 DIAGNOSIS — Z3A10 10 weeks gestation of pregnancy: Secondary | ICD-10-CM

## 2023-04-01 DIAGNOSIS — Z1339 Encounter for screening examination for other mental health and behavioral disorders: Secondary | ICD-10-CM

## 2023-04-01 DIAGNOSIS — Z3480 Encounter for supervision of other normal pregnancy, unspecified trimester: Secondary | ICD-10-CM

## 2023-04-01 DIAGNOSIS — Z3401 Encounter for supervision of normal first pregnancy, first trimester: Secondary | ICD-10-CM

## 2023-04-01 MED ORDER — PRENATAL PLUS VITAMIN/MINERAL 27-1 MG PO TABS: 1.0000 | ORAL_TABLET | Freq: Every day | ORAL | 12 refills | Status: DC

## 2023-04-01 NOTE — Patient Instructions (Signed)
The [facility] [employer] has a partnership with the [employer] to provide prenatal navigation for the most needed resources in our community. In order to see how we can help connect you to these resources we need consent to contact you. Please complete the very short consent using the link below:   English Link: https://[place].tfaforms.net/283?site=16  Spanish Link: https://[place].tfaforms.net/287?site=16  Our practice his participating in a study that provides no-cost doula care. ACURE4Moms is a study looking at how doula care can reduce birthing disparities for Black and brown birthing people. We like to refer patients as soon as possible, but definitely before 28 weeks so patients can get to know their doula.    A doula is trained to provide support before, during and just after you give birth. While doulas do not provide medical care, they do provide emotional, physical and educational support. Doulas can help reduce your stress and comfort you and your partner. They can help you cope with labor by helping you use breathing techniques, massage, creative labor positioning, essential oils and affirmations.   ACURE4Moms is a research study trying to reduce:   low birthweight babies  emergency department visits & hospitalizations for birthing persons and their babies  depression among birthing people  discrimination in pregnancy-related care ACURE4Moms is trying out 2 programs designed by  people who have given birth. These programs include: 1. Sharing patient data and warning alerts with clinic staff to keep them accountable for their patients' outcomes and providing tools to help them  reduce bias in care. 2. Matching eligible patients with doulas from the  same community as the patients.  If you would like to participate in this study, please visit:   http://carroll-castaneda.info/  Options for Doula Care in the [place] Area  As you review  your birthing options, consider having a birth doula. A doula is trained to provide support before, during and just after you give birth. There are also postpartum doulas that help you adjust to new parenthood.  While doulas do not provide medical care, they do provide emotional, physical and educational support. A few months before your baby arrives, doulas can help answer questions, ease concerns and help you create and support your birthing plan.    Doulas can help reduce your stress and comfort you and your partner. They can help you cope with labor by helping you use breathing techniques, massage, creative labor positioning, essential oils and affirmations.   Studies show that the benefits of having a doula include:   A more positive birth experience  Fewer requests for pain-relief medication  Less likelihood of cesarean section, commonly called a c-section   Doulas are typically hired via a [employer] account planner between you and the doula. We are happy to provide a list of the most active doulas in the area, all of whom are credentialed by [facility] and will not count as a visitor at your birth.  There are several options for no-cost doula care at our hospital, including:  [facility] Volunteer Doula Program Every [employer] Program A Cure 4 Moms Doula Study (available only at [employer] [facility], [place], [place] and [facility] offices)  For more information on these programs or to receive a list of doulas active in our area, please email [email or web address]

## 2023-04-01 NOTE — Progress Notes (Signed)
New OB Intake  I connected with [name]  on 04/01/23 at  9:15 AM EDT by In Person Visit and verified that I am speaking with the correct person using two identifiers. Nurse is located at [facility] and pt is located at [place].  I discussed the limitations, risks, security and privacy concerns of performing an evaluation and management service by telephone and the availability of in person appointments. I also discussed with the patient that there may be a patient responsible charge related to this service. The patient expressed understanding and agreed to proceed.  I explained I am completing New OB Intake today. We discussed EDD of 10/23/2023, by Ultrasound. Pt is G1P0000. I reviewed her allergies, medications and Medical/Surgical/OB history.    Patient Active Problem List   Diagnosis Date Noted   Prediabetes 07/07/2022   Amenorrhea 07/07/2022   Screening examination for STD (sexually transmitted disease) 05/08/2022   Ear pain 05/08/2022   Candidiasis, intertrigo 01/08/2022   Acute vaginitis 01/08/2022   Anxiety and depression 10/28/2021   Iron deficiency anemia 10/28/2021   Tremor 10/28/2021   Nocturnal enuresis 10/28/2021   Vapes nicotine containing substance 10/28/2021   Unprotected sexual intercourse 10/28/2021   Intolerance to cold 10/28/2021   Morbid obesity (HCC) 09/16/2021   Screening due 09/16/2021   Breakthrough bleeding 09/16/2021   Breakthrough bleeding on Nexplanon 02/07/2020   Nexplanon in place 02/06/2020    Concerns addressed today  Delivery Plans Plans to deliver at [facility]. Discussed the nature of our practice with multiple providers including residents and students. Due to the size of the practice, the delivering provider may not be the same as those providing prenatal care.   Patient is interested in water birth. Offered upcoming OB visit with CNM to discuss further.  MyChart/Babyscripts MyChart access verified. I explained pt will have some visits in  office and some virtually. Babyscripts instructions given and order placed. Patient verifies receipt of registration text/e-mail. Account successfully created and app downloaded.  Blood Pressure Cuff/Weight Scale Has access to a BP cuff weekly. Explained after first prenatal appt pt will check weekly and document in Babyscripts. Patient does not have weight scale; patient may purchase if they desire to track weight weekly in Babyscripts.  Anatomy US Explained first scheduled [place] will be around 19 weeks. Anatomy US scheduled for TBD at TBD.  Interested in [place]? If yes, send referral and doula dot phrase.   Is patient a candidate for Babyscripts Optimization? No - high risk  First visit review I reviewed new OB appt with patient. Explained pt will be seen by Dr. Donavan Foil at first visit. Discussed Avelina Laine genetic screening with patient. Requests Panorama and Horizon.. Routine prenatal labs  collected at today's visit.    Last Pap No results found for: "DIAGPAP"  Harrel Lemon, RN 04/01/2023  9:58 AM

## 2023-04-02 LAB — CBC/D/PLT+RPR+RH+ABO+RUBIGG...
Antibody Screen: NEGATIVE
Basophils Absolute: 0 10*3/uL (ref 0.0–0.2)
Basos: 0 %
EOS (ABSOLUTE): 0 10*3/uL (ref 0.0–0.4)
Eos: 0 %
HCV Ab: NONREACTIVE
HIV Screen 4th Generation wRfx: NONREACTIVE
Hematocrit: 38.5 % (ref 34.0–46.6)
Hemoglobin: 12.2 g/dL (ref 11.1–15.9)
Hepatitis B Surface Ag: NEGATIVE
Immature Grans (Abs): 0 10*3/uL (ref 0.0–0.1)
Immature Granulocytes: 0 %
Lymphocytes Absolute: 1.1 10*3/uL (ref 0.7–3.1)
Lymphs: 19 %
MCH: 27 pg (ref 26.6–33.0)
MCHC: 31.7 g/dL (ref 31.5–35.7)
MCV: 85 fL (ref 79–97)
Monocytes Absolute: 0.4 10*3/uL (ref 0.1–0.9)
Monocytes: 7 %
Neutrophils Absolute: 4.1 10*3/uL (ref 1.4–7.0)
Neutrophils: 74 %
Platelets: 353 10*3/uL (ref 150–450)
RBC: 4.52 x10E6/uL (ref 3.77–5.28)
RDW: 13.8 % (ref 11.7–15.4)
RPR Ser Ql: NONREACTIVE
Rh Factor: POSITIVE
Rubella Antibodies, IGG: 2.46 {index} (ref >0.99)
WBC: 5.6 10*3/uL (ref 3.4–10.8)

## 2023-04-02 LAB — COMPREHENSIVE METABOLIC PANEL
ALT: 48 [IU]/L — ABNORMAL HIGH (ref 0–32)
AST: 21 [IU]/L (ref 0–40)
Albumin: 3.6 g/dL — ABNORMAL LOW (ref 4.0–5.0)
Alkaline Phosphatase: 65 [IU]/L (ref 42–106)
BUN/Creatinine Ratio: 13 (ref 9–23)
BUN: 7 mg/dL (ref 6–20)
Bilirubin Total: 0.2 mg/dL (ref 0.0–1.2)
CO2: 17 mmol/L — ABNORMAL LOW (ref 20–29)
Calcium: 9.5 mg/dL (ref 8.7–10.2)
Chloride: 101 mmol/L (ref 96–106)
Creatinine, Ser: 0.52 mg/dL — ABNORMAL LOW (ref 0.57–1.00)
Globulin, Total: 3.1 g/dL (ref 1.5–4.5)
Glucose: 116 mg/dL — ABNORMAL HIGH (ref 70–99)
Potassium: 4.3 mmol/L (ref 3.5–5.2)
Sodium: 134 mmol/L (ref 134–144)
Total Protein: 6.7 g/dL (ref 6.0–8.5)
eGFR: 136 mL/min/{1.73_m2} (ref >59)

## 2023-04-02 LAB — HEMOGLOBIN A1C
Est. average glucose Bld gHb Est-mCnc: 120 mg/dL
Hgb A1c MFr Bld: 5.8 % — ABNORMAL HIGH (ref 4.8–5.6)

## 2023-04-02 LAB — HCV INTERPRETATION

## 2023-04-03 LAB — URINE CULTURE, OB REFLEX

## 2023-04-03 LAB — CULTURE, OB URINE

## 2023-04-05 ENCOUNTER — Encounter: Payer: Self-pay | Admitting: Obstetrics and Gynecology

## 2023-04-05 DIAGNOSIS — R7989 Other specified abnormal findings of blood chemistry: Secondary | ICD-10-CM | POA: Insufficient documentation

## 2023-04-08 ENCOUNTER — Encounter: Payer: Self-pay | Admitting: Obstetrics and Gynecology

## 2023-04-08 LAB — PANORAMA PRENATAL TEST FULL PANEL:PANORAMA TEST PLUS 5 ADDITIONAL MICRODELETIONS: FETAL FRACTION: 3.2

## 2023-04-10 LAB — HORIZON CUSTOM: REPORT SUMMARY: NEGATIVE

## 2023-04-14 ENCOUNTER — Other Ambulatory Visit: Payer: Medicaid Other

## 2023-05-06 ENCOUNTER — Other Ambulatory Visit: Payer: Medicaid Other

## 2023-05-06 ENCOUNTER — Ambulatory Visit: Payer: Medicaid Other | Admitting: Obstetrics and Gynecology

## 2023-05-06 VITALS — BP 118/73 | HR 76 | Wt 346.0 lb

## 2023-05-06 DIAGNOSIS — Z3402 Encounter for supervision of normal first pregnancy, second trimester: Secondary | ICD-10-CM

## 2023-05-06 DIAGNOSIS — R7309 Other abnormal glucose: Secondary | ICD-10-CM

## 2023-05-06 DIAGNOSIS — Z6841 Body Mass Index (BMI) 40.0 and over, adult: Secondary | ICD-10-CM | POA: Diagnosis not present

## 2023-05-06 DIAGNOSIS — Z1339 Encounter for screening examination for other mental health and behavioral disorders: Secondary | ICD-10-CM | POA: Diagnosis not present

## 2023-05-06 DIAGNOSIS — O99212 Obesity complicating pregnancy, second trimester: Secondary | ICD-10-CM

## 2023-05-06 DIAGNOSIS — Z3480 Encounter for supervision of other normal pregnancy, unspecified trimester: Secondary | ICD-10-CM

## 2023-05-06 DIAGNOSIS — Z3A15 15 weeks gestation of pregnancy: Secondary | ICD-10-CM | POA: Diagnosis not present

## 2023-05-06 DIAGNOSIS — O99210 Obesity complicating pregnancy, unspecified trimester: Secondary | ICD-10-CM

## 2023-05-06 MED ORDER — ASPIRIN 81 MG PO CHEW: 162.0000 mg | CHEWABLE_TABLET | Freq: Every day | ORAL | 7 refills | Status: DC

## 2023-05-06 NOTE — Progress Notes (Signed)
INITIAL PRENATAL VISIT NOTE  Subjective:  Sonya Collins is a 22 y.o. G1P0000 at [redacted]w[redacted]d by early ultrasound being seen today for her initial prenatal visit. She has an obstetric history significant for nulliparity. She has a medical history significant for obesity and BMI of 59.  Patient reports no complaints.  Contractions: Not present. Vag. Bleeding: None.   . Denies leaking of fluid.    Past Medical History:  Diagnosis Date   Depression    Obesity     Past Surgical History:  Procedure Laterality Date   MYRINGOTOMY      OB History  Gravida Para Term Preterm AB Living  1 0 0 0 0 0  SAB IAB Ectopic Multiple Live Births  0 0 0 0 0    # Outcome Date GA Lbr Len/2nd Weight Sex Type Anes PTL Lv  1 Current             Social History   Socioeconomic History   Marital status: Single    Spouse name: Not on file   Number of children: Not on file   Years of education: Not on file   Highest education level: Not on file  Occupational History   Not on file  Tobacco Use   Smoking status: Former    Types: E-cigarettes    Quit date: 02/12/2023    Years since quitting: 0.2   Smokeless tobacco: Never  Vaping Use   Vaping status: Former  Substance and Sexual Activity   Alcohol use: Not Currently    Comment: on weekends   Drug use: Not Currently    Types: Marijuana    Comment: occ   Sexual activity: Yes    Partners: Male    Birth control/protection: None  Other Topics Concern   Not on file  Social History Narrative   Lives with her grandparents, works at [employer] office.    Social Determinants of Health   Financial Resource Strain: Not on file  Food Insecurity: Not on file  Transportation Needs: Not on file  Physical Activity: Not on file  Stress: Not on file  Social Connections: Not on file    Family History  Problem Relation Age of Onset   Hypertension Father    Diabetes Father    Schizophrenia Brother    Breast cancer Other    Breast cancer Other       Current Outpatient Medications:    aspirin 81 MG chewable tablet, Chew 2 tablets (162 mg total) by mouth daily., Disp: 60 tablet, Rfl: 7   FLUoxetine (PROZAC) 10 MG tablet, Take 10 mg by mouth daily. (Patient not taking: Reported on 04/01/2023), Disp: , Rfl:    Prenatal Vit-Fe Fumarate-FA (PRENATAL PLUS VITAMIN/MINERAL) 27-1 MG TABS, Take 1 tablet by mouth daily., Disp: 30 tablet, Rfl: 12   Prenatal Vit-Fe Fumarate-FA (PRENATAL VITAMIN PLUS LOW IRON) 27-1 MG TABS, 1 tablet PO daily, Disp: 90 tablet, Rfl: 6  Allergies  Allergen Reactions   Kiwi Extract     Review of Systems: Negative except for what is mentioned in HPI.  Objective:   Vitals:   05/06/23 0918  BP: 118/73  Pulse: 76  Weight: (!) 346 lb (156.9 kg)    Fetal Status: Fetal Heart Rate (bpm): 159         Physical Exam: BP 118/73   Pulse 76   Wt (!) 346 lb (156.9 kg)   LMP 01/10/2023   BMI 59.39 kg/m  CONSTITUTIONAL: Well-developed, well-nourished female in no acute  distress.  NEUROLOGIC: Alert and oriented to person, place, and time. Normal reflexes, muscle tone coordination. No cranial nerve deficit noted. PSYCHIATRIC: Normal mood and affect. Normal behavior. Normal judgment and thought content. SKIN: Skin is warm and dry. No rash noted. Not diaphoretic. No erythema. No pallor. HENT:  Normocephalic, atraumatic, External right and left ear normal. Oropharynx is clear and moist EYES: Conjunctivae and EOM are normal.  NECK: Normal range of motion, supple, no masses CARDIOVASCULAR: Normal heart rate noted, regular rhythm RESPIRATORY: Effort and breath sounds normal, no problems with respiration noted BREASTS: deferred ABDOMEN: Soft, nontender, nondistended, gravid. [id number] MUSCULOSKELETAL: Normal range of motion. EXT:  No edema and no tenderness. 2+ distal pulses.   Assessment and Plan:  Pregnancy: G1P0000 at [redacted]w[redacted]d by early ultrasound  1. Supervision of other normal pregnancy, antepartum Continue  routine prenatal care  - Ambulatory referral to Integrated Behavior[facility] - Protein / creatinine ratio, urine  2. [redacted] weeks gestation of pregnancy   3. Obesity in pregnancy Pt at increased risk for preeclampsia, will; start baby ASA now Early 2 hour GTT today - aspirin 81 MG chewable tablet; Chew 2 tablets (162 mg total) by mouth daily.  Dispense: 60 tablet; Refill: 7  4. BMI 50.0-59.9, adult (HCC)    Preterm labor symptoms and general obstetric precautions including but not limited to vaginal bleeding, contractions, leaking of fluid and fetal movement were reviewed in detail with the patient.  Please refer to After Visit Summary for other counseling recommendations.   Return in about 4 weeks (around 06/03/2023) for [facility], in person.  Warden Fillers 05/06/2023 11:23 AM

## 2023-05-07 LAB — COMPREHENSIVE METABOLIC PANEL
ALT: 21 [IU]/L (ref 0–32)
AST: 15 [IU]/L (ref 0–40)
Albumin: 3.5 g/dL — ABNORMAL LOW (ref 4.0–5.0)
Alkaline Phosphatase: 60 [IU]/L (ref 42–106)
BUN/Creatinine Ratio: 12 (ref 9–23)
BUN: 6 mg/dL (ref 6–20)
Bilirubin Total: 0.4 mg/dL (ref 0.0–1.2)
CO2: 20 mmol/L (ref 20–29)
Calcium: 9.4 mg/dL (ref 8.7–10.2)
Chloride: 102 mmol/L (ref 96–106)
Creatinine, Ser: 0.52 mg/dL — ABNORMAL LOW (ref 0.57–1.00)
Globulin, Total: 3.1 g/dL (ref 1.5–4.5)
Glucose: 85 mg/dL (ref 70–99)
Potassium: 4.3 mmol/L (ref 3.5–5.2)
Sodium: 137 mmol/L (ref 134–144)
Total Protein: 6.6 g/dL (ref 6.0–8.5)
eGFR: 136 mL/min/{1.73_m2} (ref >59)

## 2023-05-07 LAB — PROTEIN / CREATININE RATIO, URINE
Creatinine, Urine: 245.2 mg/dL
Protein, Ur: 30.1 mg/dL
Protein/Creat Ratio: 123 mg/g{creat} (ref 0–200)

## 2023-05-07 LAB — GLUCOSE TOLERANCE, 2 HOURS W/ 1HR
Glucose, 1 hour: 145 mg/dL (ref 70–179)
Glucose, 2 hour: 122 mg/dL (ref 70–152)
Glucose, Fasting: 87 mg/dL (ref 70–91)

## 2023-05-08 LAB — AFP, SERUM, OPEN SPINA BIFIDA
AFP MoM: 1.18
AFP Value: 26.2 ng/mL
Gest. Age on Collection Date: 15.7 wk
Maternal Age At EDD: 21.2 a
OSBR Risk 1 IN: 10000
Test Results:: NEGATIVE
Weight: 342 [lb_av]

## 2023-05-20 ENCOUNTER — Telehealth: Payer: Self-pay | Admitting: Licensed Clinical Social Worker

## 2023-05-20 NOTE — Telephone Encounter (Signed)
[facility] contacted patient on this date to provide [facility] intro and schedule BH appt. Patient reports she's not interested in [facility] services at this time.

## 2023-05-21 ENCOUNTER — Telehealth: Payer: Self-pay

## 2023-05-21 NOTE — Telephone Encounter (Signed)
Spoke with patient - attempted to move up to 12/3 - patient could not move due to needing clearance at job - 12/9 date and time had already been cleared

## 2023-05-26 ENCOUNTER — Encounter: Payer: Self-pay | Admitting: *Deleted

## 2023-05-28 ENCOUNTER — Inpatient Hospital Stay (HOSPITAL_COMMUNITY)
Admission: AD | Admit: 2023-05-28 | Discharge: 2023-05-28 | Disposition: A | Discharge status: Home / Self Care | Payer: Medicaid Other | Attending: Obstetrics and Gynecology | Admitting: Obstetrics and Gynecology

## 2023-05-28 DIAGNOSIS — Z3A18 18 weeks gestation of pregnancy: Secondary | ICD-10-CM | POA: Insufficient documentation

## 2023-05-28 DIAGNOSIS — N949 Unspecified condition associated with female genital organs and menstrual cycle: Secondary | ICD-10-CM | POA: Diagnosis not present

## 2023-05-28 DIAGNOSIS — O26892 Other specified pregnancy related conditions, second trimester: Secondary | ICD-10-CM | POA: Insufficient documentation

## 2023-05-28 DIAGNOSIS — O368120 Decreased fetal movements, second trimester, not applicable or unspecified: Secondary | ICD-10-CM | POA: Insufficient documentation

## 2023-05-28 DIAGNOSIS — R102 Pelvic and perineal pain: Secondary | ICD-10-CM | POA: Diagnosis present

## 2023-05-28 MED ORDER — CYCLOBENZAPRINE HCL 5 MG PO TABS
5.0000 mg | ORAL_TABLET | Freq: Three times a day (TID) | ORAL | 0 refills | Status: DC | PRN
Start: 2023-05-28 — End: 2024-01-19

## 2023-05-28 NOTE — MAU Note (Signed)
Sonya Collins is a 22 y.o. at [redacted]w[redacted]d here in MAU reporting: usually he moves, and it has been like 2 days since she felt any movement. No bleeding, leaking or pain.  Onset of complaint: 2 days Pain score: none Vitals:   05/28/23 1017  BP: 128/68  Pulse: 87  Resp: 18  Temp: 98.6 F (37 C)  SpO2: 97%     FHT:156 Lab orders placed from triage:  none

## 2023-05-28 NOTE — MAU Provider Note (Signed)
History     CSN: [id number]  Arrival date and time: 05/28/23 4132   Event Date/Time   First Provider Initiated Contact with Patient 05/28/2023 10:17 AM   Chief Complaint  Patient presents with   Decreased Fetal Movement    HPI  Sonya Collins is a 22 y.o. G1P0000 at [redacted]w[redacted]d who presents to the MAU for decreased fetal movement. Pt first started feeling FM a couple of weeks ago and had consistently felt fetal movement. Reports this AM, didn't feel any FM so wanted to make sure everything w baby was ok. Denies cramping, LOF,  VB.  Also having groin pain that she describes as shooting. It is present bilaterally and consistently. Specifically denies change in vaginal discharge or any urinary sxs. Got a maternity belt, but had issues with sizing. She also has been taking Tylenol with minimal relief of sxs.  Past Medical History:  Diagnosis Date   Candidiasis, intertrigo 01/08/2022   Depression    Intolerance to cold 10/28/2021   Nocturnal enuresis 10/28/2021   Obesity    Tremor 10/28/2021    Past Surgical History:  Procedure Laterality Date   MYRINGOTOMY      Family History  Problem Relation Age of Onset   Hypertension Father    Diabetes Father    Schizophrenia Brother    Breast cancer Other    Breast cancer Other     Social History   Tobacco Use   Smoking status: Former    Types: E-cigarettes    Quit date: 02/12/2023    Years since quitting: 0.2   Smokeless tobacco: Never  Vaping Use   Vaping status: Former  Substance Use Topics   Alcohol use: Not Currently    Comment: on weekends   Drug use: Not Currently    Types: Marijuana    Comment: occ    Allergies:  Allergies  Allergen Reactions   Kiwi Extract     Medications Prior to Admission  Medication Sig Dispense Refill Last Dose   aspirin 81 MG chewable tablet Chew 2 tablets (162 mg total) by mouth daily. 60 tablet 7    FLUoxetine (PROZAC) 10 MG tablet Take 10 mg by mouth daily. (Patient not taking:  Reported on 04/01/2023)      Prenatal Vit-Fe Fumarate-FA (PRENATAL PLUS VITAMIN/MINERAL) 27-1 MG TABS Take 1 tablet by mouth daily. 30 tablet 12    Prenatal Vit-Fe Fumarate-FA (PRENATAL VITAMIN PLUS LOW IRON) 27-1 MG TABS 1 tablet PO daily 90 tablet 6     ROS reviewed and pertinent positives and negatives as documented in HPI.  Physical Exam   Blood pressure 128/68, pulse 87, temperature 98.6 F (37 C), temperature source Oral, resp. rate 18, height 5\' 4"  (1.626 m), weight (!) 161.9 kg, last menstrual period 01/10/2023, SpO2 97%.  Physical Exam Constitutional:      General: She is not in acute distress.    Appearance: Normal appearance. She is not ill-appearing.  HENT:     Head: Normocephalic and atraumatic.  Cardiovascular:     Rate and Rhythm: Normal rate.  Pulmonary:     Effort: Pulmonary effort is normal.     Breath sounds: Normal breath sounds.  Abdominal:     Palpations: Abdomen is soft.     Tenderness: There is no abdominal tenderness.  Musculoskeletal:        General: Normal range of motion.  Skin:    General: Skin is warm and dry.     Findings: No rash.  Neurological:  General: No focal deficit present.     Mental Status: She is alert and oriented to person, place, and time.     MAU Course  Procedures  MDM 22 y.o. G1P0000 at [redacted]w[redacted]d presenting for decreased fetal movement at [redacted]w[redacted]d. Discussed typical course of feeling FM with pt, how minor movements may not be felt. She is otherwise well appearing and her other symptoms are consistent with round ligament pain. I performed a brief bedside ultrasound for patient, saw +fetal movement and +FCA. Pt was reassured after ultrasound. Flexeril sent to pharmacy. Encouraged pt to find a better fitting maternity belt and given patient education. Stable for d/c.   Assessment and Plan  Decreased fetal movements in second trimester, single or unspecified fetus - Plan: Discharge patient +FM and +FCA on BSUS Discussed typical  course of FM w pt  Round ligament pain Rx for Flexeril sent Cont Tylenol Use maternity belt  Sundra Aland, MD OB Fellow, Faculty Practice [facility], Center for [facility]  05/28/2023, 10:51 AM

## 2023-06-07 ENCOUNTER — Ambulatory Visit (INDEPENDENT_AMBULATORY_CARE_PROVIDER_SITE_OTHER): Payer: Medicaid Other | Admitting: Obstetrics and Gynecology

## 2023-06-07 ENCOUNTER — Ambulatory Visit (INDEPENDENT_AMBULATORY_CARE_PROVIDER_SITE_OTHER): Payer: Medicaid Other | Admitting: Licensed Clinical Social Worker

## 2023-06-07 ENCOUNTER — Ambulatory Visit: Payer: Medicaid Other | Attending: Obstetrics and Gynecology

## 2023-06-07 ENCOUNTER — Other Ambulatory Visit: Payer: Self-pay | Admitting: *Deleted

## 2023-06-07 ENCOUNTER — Ambulatory Visit: Payer: Medicaid Other | Admitting: *Deleted

## 2023-06-07 ENCOUNTER — Encounter: Payer: Self-pay | Admitting: *Deleted

## 2023-06-07 ENCOUNTER — Other Ambulatory Visit: Payer: Self-pay

## 2023-06-07 VITALS — BP 113/76 | HR 85 | Wt 358.0 lb

## 2023-06-07 VITALS — BP 126/74 | HR 94

## 2023-06-07 DIAGNOSIS — Z3A20 20 weeks gestation of pregnancy: Secondary | ICD-10-CM | POA: Diagnosis not present

## 2023-06-07 DIAGNOSIS — R7303 Prediabetes: Secondary | ICD-10-CM

## 2023-06-07 DIAGNOSIS — Z3481 Encounter for supervision of other normal pregnancy, first trimester: Secondary | ICD-10-CM | POA: Diagnosis not present

## 2023-06-07 DIAGNOSIS — F419 Anxiety disorder, unspecified: Secondary | ICD-10-CM

## 2023-06-07 DIAGNOSIS — O26899 Other specified pregnancy related conditions, unspecified trimester: Secondary | ICD-10-CM

## 2023-06-07 DIAGNOSIS — Z3480 Encounter for supervision of other normal pregnancy, unspecified trimester: Secondary | ICD-10-CM

## 2023-06-07 DIAGNOSIS — O99210 Obesity complicating pregnancy, unspecified trimester: Secondary | ICD-10-CM

## 2023-06-07 DIAGNOSIS — O321XX0 Maternal care for breech presentation, not applicable or unspecified: Secondary | ICD-10-CM | POA: Diagnosis not present

## 2023-06-07 DIAGNOSIS — Z363 Encounter for antenatal screening for malformations: Secondary | ICD-10-CM | POA: Diagnosis present

## 2023-06-07 DIAGNOSIS — Z362 Encounter for other antenatal screening follow-up: Secondary | ICD-10-CM

## 2023-06-07 DIAGNOSIS — O358XX0 Maternal care for other (suspected) fetal abnormality and damage, not applicable or unspecified: Secondary | ICD-10-CM | POA: Diagnosis not present

## 2023-06-07 DIAGNOSIS — R7989 Other specified abnormal findings of blood chemistry: Secondary | ICD-10-CM

## 2023-06-07 DIAGNOSIS — Z6841 Body Mass Index (BMI) 40.0 and over, adult: Secondary | ICD-10-CM

## 2023-06-07 DIAGNOSIS — O99212 Obesity complicating pregnancy, second trimester: Secondary | ICD-10-CM | POA: Diagnosis not present

## 2023-06-07 DIAGNOSIS — F4323 Adjustment disorder with mixed anxiety and depressed mood: Secondary | ICD-10-CM

## 2023-06-07 DIAGNOSIS — R102 Pelvic and perineal pain unspecified side: Secondary | ICD-10-CM

## 2023-06-07 DIAGNOSIS — F32A Depression, unspecified: Secondary | ICD-10-CM
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
## 2023-06-14 ENCOUNTER — Telehealth: Payer: Medicaid Other | Admitting: Nurse Practitioner

## 2023-06-14 DIAGNOSIS — B3731 Acute candidiasis of vulva and vagina: Secondary | ICD-10-CM | POA: Diagnosis not present

## 2023-06-14 MED ORDER — MICONAZOLE NITRATE 2 % VA CREA
1.0000 | TOPICAL_CREAM | Freq: Every day | VAGINAL | 0 refills | Status: AC
Start: 2023-06-14 — End: 2023-06-21

## 2023-06-14 NOTE — Progress Notes (Signed)
E-Visit for Vaginal Symptoms  We are sorry that you are not feeling well. Here is how we plan to help! Based on what you shared with me it looks like you: May have a yeast vaginosis  Vaginosis is an inflammation of the vagina that can result in discharge, itching and pain. The cause is usually a change in the normal balance of vaginal bacteria or an infection. Vaginosis can also result from reduced estrogen levels after menopause.  The most common causes of vaginosis are:   Bacterial vaginosis which results from an overgrowth of one on several organisms that are normally present in your vagina.   Yeast infections which are caused by a naturally occurring fungus called candida.   Vaginal atrophy (atrophic vaginosis) which results from the thinning of the vagina from reduced estrogen levels after menopause.   Trichomoniasis which is caused by a parasite and is commonly transmitted by sexual intercourse.  Factors that increase your risk of developing vaginosis include: Medications, such as antibiotics and steroids Uncontrolled diabetes Use of hygiene products such as bubble bath, vaginal spray or vaginal deodorant Douching Wearing damp or tight-fitting clothing Using an intrauterine device (IUD) for birth control Hormonal changes, such as those associated with pregnancy, birth control pills or menopause Sexual activity Having a sexually transmitted infection  Your treatment plan is Monistat (miconazole) as this is the recommendation while pregnancy   Meds ordered this encounter  Medications   miconazole (MONISTAT 7) 2 % vaginal cream    Sig: Place 1 Applicatorful vaginally at bedtime for 7 days.    Dispense:  315 g    Refill:  0     Be sure to take all of the medication as directed. Stop taking any medication if you develop a rash, tongue swelling or shortness of breath. Mothers who are breast feeding should consider pumping and discarding their breast milk while on these  antibiotics. However, there is no consensus that infant exposure at these doses would be harmful.  Remember that medication creams can weaken latex condoms. Marland Kitchen   HOME CARE:  Good hygiene may prevent some types of vaginosis from recurring and may relieve some symptoms:  Avoid baths, hot tubs and whirlpool spas. Rinse soap from your outer genital area after a shower, and dry the area well to prevent irritation. Don't use scented or harsh soaps, such as those with deodorant or antibacterial action. Avoid irritants. These include scented tampons and pads. Wipe from front to back after using the toilet. Doing so avoids spreading fecal bacteria to your vagina.  Other things that may help prevent vaginosis include:  Don't douche. Your vagina doesn't require cleansing other than normal bathing. Repetitive douching disrupts the normal organisms that reside in the vagina and can actually increase your risk of vaginal infection. Douching won't clear up a vaginal infection. Use a latex condom. Both female and female latex condoms may help you avoid infections spread by sexual contact. Wear cotton underwear. Also wear pantyhose with a cotton crotch. If you feel comfortable without it, skip wearing underwear to bed. Yeast thrives in [employer] Your symptoms should improve in the next day or two.  GET HELP RIGHT AWAY IF:  You have pain in your lower abdomen ( pelvic area or over your ovaries) You develop nausea or vomiting You develop a fever Your discharge changes or worsens You have persistent pain with intercourse You develop shortness of breath, a rapid pulse, or you faint.  These symptoms could be signs of problems  or infections that need to be evaluated by a medical provider now.  MAKE SURE YOU   Understand these instructions. Will watch your condition. Will get help right away if you are not doing well or get worse.  Thank you for choosing an e-visit.  Your e-visit answers were  reviewed by a board certified advanced clinical practitioner to complete your personal care plan. Depending upon the condition, your plan could have included both over the counter or prescription medications.  Please review your pharmacy choice. Make sure the pharmacy is open so you can pick up prescription now. If there is a problem, you may contact your provider through [employer] and have the prescription routed to another pharmacy.  Your safety is important to [place]. If you have drug allergies check your prescription carefully.   For the next 24 hours you can use MyChart to ask questions about today's visit, request a non-urgent call back, or ask for a work or school excuse. You will get an email in the next two days asking about your experience. I hope that your e-visit has been valuable and will speed your recovery.   I spent approximately 5 minutes reviewing the patient's history, current symptoms and coordinating their care today.

## 2023-06-15 ENCOUNTER — Telehealth: Payer: Self-pay | Admitting: *Deleted

## 2023-06-15 NOTE — Telephone Encounter (Signed)
Pt called to office- left no message only call back number.   Attempt to return call. No answer, LVM to call if needed.

## 2023-06-30 NOTE — L&D Delivery Note (Signed)
 OB/GYN Faculty Practice Delivery Note  Sonya Collins is a 23 y.o. G1P0000 s/p SVD at [redacted]w[redacted]d. She was admitted for IOL for gHTN.   ROM: 28h 51m with clear fluid GBS Status: Negative/-- (04/03 1558) Maximum Maternal Temperature: 99.50F  Labor Progress: Initial SVE: 1/30/-2. Ripened with Cooks and Cytotec . Augmentation with AROM and Pitocin . Course complicated by difficulty controlling pain, several epidural boluses. She then progressed to complete.   Delivery Date/Time: 10/13/2023 @0821   Delivery: Called to room and patient was complete and pushing. Head delivered OA. No nuchal cord present. Shoulder and body delivered in usual fashion. Infant with spontaneous cry, placed on mother's abdomen, dried and stimulated. Cord clamped x 2 after 1-minute delay, and cut by grandma. Cord blood drawn. Placenta delivered spontaneously with gentle cord traction. Fundus firm with massage and Pitocin . Labia, perineum, vagina, and cervix inspected with 1st degree perineal laceration. Repair with 3.0 Vicryl figure of eight stitch.   Baby Weight: 3420g  Placenta: 3 vessel, intact. Sent to L&D Complications: None Lacerations: 1st degree EBL: 200 mL Analgesia: Epidural   Infant:  APGAR (1 MIN): 8  APGAR (5 MINS): 9   Darrow End, MD OB Family Medicine Fellow, [facility] for [facility], [facility] 10/13/2023, 9:12 AM

## 2023-07-05 ENCOUNTER — Encounter: Payer: Medicaid Other | Admitting: Obstetrics and Gynecology

## 2023-07-05 ENCOUNTER — Ambulatory Visit: Payer: Medicaid Other | Admitting: Licensed Clinical Social Worker

## 2023-07-12 ENCOUNTER — Ambulatory Visit: Payer: Medicaid Other | Admitting: *Deleted

## 2023-07-12 ENCOUNTER — Ambulatory Visit: Payer: Medicaid Other | Attending: Maternal & Fetal Medicine

## 2023-07-12 ENCOUNTER — Ambulatory Visit (INDEPENDENT_AMBULATORY_CARE_PROVIDER_SITE_OTHER): Payer: Medicaid Other

## 2023-07-12 ENCOUNTER — Ambulatory Visit: Payer: Medicaid Other | Admitting: Licensed Clinical Social Worker

## 2023-07-12 ENCOUNTER — Other Ambulatory Visit: Payer: Self-pay | Admitting: *Deleted

## 2023-07-12 ENCOUNTER — Other Ambulatory Visit: Payer: Self-pay

## 2023-07-12 VITALS — BP 129/82 | HR 94 | Wt 358.4 lb

## 2023-07-12 VITALS — BP 136/78 | HR 100

## 2023-07-12 DIAGNOSIS — Z3A25 25 weeks gestation of pregnancy: Secondary | ICD-10-CM

## 2023-07-12 DIAGNOSIS — O99212 Obesity complicating pregnancy, second trimester: Secondary | ICD-10-CM

## 2023-07-12 DIAGNOSIS — Z3480 Encounter for supervision of other normal pregnancy, unspecified trimester: Secondary | ICD-10-CM

## 2023-07-12 DIAGNOSIS — E669 Obesity, unspecified: Secondary | ICD-10-CM

## 2023-07-12 DIAGNOSIS — O99810 Abnormal glucose complicating pregnancy: Secondary | ICD-10-CM | POA: Diagnosis not present

## 2023-07-12 DIAGNOSIS — Z6841 Body Mass Index (BMI) 40.0 and over, adult: Secondary | ICD-10-CM

## 2023-07-12 DIAGNOSIS — O99210 Obesity complicating pregnancy, unspecified trimester: Secondary | ICD-10-CM | POA: Insufficient documentation

## 2023-07-12 DIAGNOSIS — Z362 Encounter for other antenatal screening follow-up: Secondary | ICD-10-CM | POA: Diagnosis present
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
## 2023-07-12 NOTE — Progress Notes (Signed)
 Pt presents for ROB visit. Pt has concerns about liver enzymes

## 2023-07-12 NOTE — Patient Instructions (Signed)
 We highly recommend childbirth education to help you plan for labor and begin practicing coping skills (which will be needed with or without pain meds).  [place] Childbirth Education Options: Sign up by visiting [email or web address]  Childbirth ~ Self-Paced eClass (English and Spanish) This online class offers you the freedom to complete a childbirth education series in the comfort of your own home at your own pace.  Childbirth Class (In-Person 4-Week Series  or on Saturdays, Virtual 4-Week Series ~ [place]) This interactive in-person class series will help you and your partner prepare for your birth experience. Topics include: Labor & Birth, Comfort Measures, Breathing Techniques, Massage, Medical Interventions, Pain Management Options, Cesarean Birth, Postpartum Care, and Newborn Care  Comfort Techniques for Labor ~ In-Person Class [facility]) This interactive class is designed for parents-to-be who want to learn & practice hands-on skills to help relieve some of the discomfort of labor and encourage their babies to rotate toward the best position for birth. Moms and their partners will be able to try a variety of labor positions with birth balls and rebozos as well as practice breathing, relaxation, and visualization techniques.  Natural Childbirth Class (In-Person 5-Week Series, In-Person on Saturdays or Virtual 5-Week Series ~ [place]) This class series is designed for expectant parents who want to learn and practice natural methods of coping with the process of labor and childbirth.  Cesarean Birth Self-Paced eClass (English and Spanish) This online course provides comprehensive information you can trust as you prepare for a possible cesarean birth. In this class, you'll learn how to make your birth and recovery comfortable and joyful through instructive video clips, animations, and activities.  Waterbirth ~ Airline pilot Interested in a waterbirth? In addition to a consultation  with your credentialed waterbirth provider, this free, informational online class will help you discover whether waterbirth is the right fit for you. Not all obstetrical practices offer waterbirth, so check with your healthcare provider.  Tour Probation officer) - [facility] [employer] our 4 minute video tour of [facility] located in [place].   [place] Parenting Education Options:  Pregnancy 101 (Virtual) Congratulations on your pregnancy! This class is geared toward moms in their first trimester, but everyone is welcome. We are excited to guide you through all aspects of supporting a healthy pregnancy. You will learn what to expect at routine prenatal care appointments, common postpartum adjustments, basic infant safety, and breastfeeding.  Successful Partnering & Parenting ~ In-Person Workshop [facility]) This workshop inspires and equips partners of all economic levels, ages, and cultures to confidently care for their infants, support the birthing persons, and navigate their own transformations into new partners and parents. Learning activities are geared towards supporting partner, but moms are welcome to attend.  'Baby & Me' Parenting Group (Virtual on Wednesdays at 11am) Enjoy this time discussing newborn & infant parenting topics and family adjustment issues with other new parents in a relaxed environment. Each week brings a new speaker or baby-centered activity. This group offers support and connection to parents as they journey through the adjustments and struggles of that sometimes overwhelming first year after the birth of a child.  Baby Safety, CPR, & Choking Class ~ Virtual This life-saving information is meant to encourage parents as they learn important safety and prevention tips as well as infant CPR and relief of choking.  Breastfeeding Class (In-Person in [place] or [employer]) Families learn what to expect in the  first days and weeks of breastfeeding your  newborn. IF YOU ARE AN EMPLOYEE TAKING THIS CLASS FOR CREDIT, DO NOT register yourself. Please e-mail [email or web address].   Breastfeeding Self-Paced eClass (English & Spanish) Families learn what to expect in the first days and weeks of breastfeeding your newborn.  Caring for Baby ~ In-Person, Virtual or Self-Paced Class This in-person class is for both expectant and adoptive parents who want to learn and practice the most up-to-date newborn care for their babies. Focus is on birth through the first six weeks of life.  CPR & Choking Relief for Infants & Children ~ In-Person Class [facility]) This in-person course is designed for any parent, expectant parent, or adult who cares for infants or children. Participants learn and demonstrate cardiopulmonary resuscitation and choking relief procedures for both infants and children.  Grandparent Love ~ In-Person Class Grandparents will learn the most updated infant care and safety recommendations. They will discover ways to support their own children during the transition into the parenting role and receive tips on communicating with the new parents.  [place] Parenting Support Group Options:  Bereavement Grief Support Group (Pregnancy/Infant Loss) - Virtual This is an ongoing experience that meets once a month and is designed to help you honor the past, assist you in discovering tools to strengthen you today, and aid you in developing hope for the future.  Breastfeeding & Pumping Support Group (In-Person on Thursdays at 12pm or Virtual on Tuesdays at 5pm) Join [place] in-person each Thursday starting June 1st, 2023 at 12pm! This support group is free for all families looking for breastfeeding and/or pumping support.   Community-Based Childbirth Education Options:  [facility] Department Classes:  Childbirth education classes can help you get ready for a positive parenting experience. You  can also meet other expectant parents and get free stuff for your baby. Each class runs for five weeks on the same night and costs $45 for the mother-to-be and her support person. Medicaid covers the cost if you are eligible. Call [phone] to register.  [facility] [employer] offers a variety of programs for the [employer] and is another great way to get connected. Please go to [email or web address] for more information.  Childbirth With A Twist! Be informed of your options, get educated on birth, understand what your body is doing, learn how to cope, and have a lot of fun and laughs all while doing it either from the comfort of your couch OR in our cozy office and classroom space near the [place] airport. If you are taking a virtual class, then class is taught LIVE, so you can ask questions and receive answers in real-time from an experienced doula and childbirth educator.  This virtual childbirth education class will meet for five instruction times online.  Although we are based in [place], [place], this virtual class is open to anyone in the world. Please visit: [email or web address] for more information.  Books We Love: The Doula Guide to Childbirth by Harland German and Otila Back The First-Time Parent's Childbirth Handbook by Dr. Amie Critchley, CNM The Birth Partner by Truddie Crumble

## 2023-07-23 ENCOUNTER — Ambulatory Visit (INDEPENDENT_AMBULATORY_CARE_PROVIDER_SITE_OTHER): Payer: Medicaid Other | Admitting: Licensed Clinical Social Worker

## 2023-07-23 DIAGNOSIS — F4323 Adjustment disorder with mixed anxiety and depressed mood: Secondary | ICD-10-CM

## 2023-07-23 NOTE — BH Specialist Note (Unsigned)
Integrated Behavioral Health via Telemedicine Visit  07/29/2023 Markan Cazarez [id number]  Number of Integrated Behavioral Health Clinician visits: 2- Second Visit  Session Start time: 1628   Session End time: 1649  Total time in minutes: 21   Referring Provider: Dr. Berton Lan Patient/Family location: At [facility] Provider location: Remote Office  All persons participating in visit: Patient and [facility] Types of Service: Individual psychotherapy and Video visit  I connected with [name] and/or Luther Redo patient via  Telephone or Video Enabled Telemedicine Application  (Video is Caregility application) and verified that I am speaking with the correct person using two identifiers. Discussed confidentiality: Yes   I discussed the limitations of telemedicine and the availability of in person appointments.  Discussed there is a possibility of technology failure and discussed alternative modes of communication if that failure occurs.  I discussed that engaging in this telemedicine visit, they consent to the provision of behavioral healthcare and the services will be billed under their insurance.  Patient and/or legal guardian expressed understanding and consented to Telemedicine visit: Yes   Presenting Concerns: Patient and/or family reports the following symptoms/concerns: Improvements with anxiety and depression symptoms.  Duration of problem: Months; Severity of problem: moderate  Patient and/or Family's Strengths/Protective Factors: Social and Emotional competence, Concrete supports in place (healthy food, safe environments, etc.), and Physical Health (exercise, healthy diet, medication compliance, etc.)  Goals Addressed: Patient will:  Reduce symptoms of: anxiety and depression   Increase knowledge and/or ability of: coping skills, healthy habits, and stress reduction   Demonstrate ability to: Increase healthy adjustment to current life circumstances and Increase adequate  support systems for patient/family  Progress towards Goals: Achieved  Interventions: Interventions utilized:  Mindfulness or Relaxation Training, Supportive Counseling, and Supportive Reflection Standardized Assessments completed: Not Needed  Patient and/or Family Response: The patient reports significant improvement in her mental health, stating that she has not experienced any depression or anxiety symptoms. She also shares that she has had no concerns regarding her pregnancy, and her pelvic pain has subsided. The patient notes that her relationships with the father of her baby and her family have been going well. She declined a follow-up visit at this time and reports staying busy, focused, and positive.   Assessment: Patient currently experiencing a significant improvement in her mental health, with no current symptoms of depression or anxiety. She reports physical relief from pelvic pain, positive relationships with the father of her baby and family, and is staying focused and positive as she continues her pregnancy.   Patient may benefit from continued support of behavioral integrated health when needed.  Plan: Follow up with behavioral health clinician on : No follow up scheduled.  Behavioral recommendations: It is recommended that Kharis continue to stay focused on maintaining her positive progress by practicing self-care and staying connected with her support system. Additionally, continue utilizing coping strategies to manage stress and maintain mental and physical well-being throughout her pregnancy. Referral(s): Integrated [employer] (In Clinic)  I discussed the assessment and treatment plan with the patient and/or parent/guardian. They were provided an opportunity to ask questions and all were answered. They agreed with the plan and demonstrated an understanding of the instructions.   They were advised to call back or seek an in-person evaluation if the symptoms  worsen or if the condition fails to improve as anticipated.  Naeema Patlan Cruzita Lederer, LCSWA

## 2023-08-02 ENCOUNTER — Encounter: Payer: Self-pay | Admitting: Obstetrics and Gynecology

## 2023-08-09 ENCOUNTER — Other Ambulatory Visit: Payer: Self-pay

## 2023-08-09 ENCOUNTER — Other Ambulatory Visit: Payer: Self-pay | Admitting: *Deleted

## 2023-08-09 ENCOUNTER — Encounter: Payer: Medicaid Other | Admitting: Obstetrics & Gynecology

## 2023-08-09 ENCOUNTER — Ambulatory Visit: Payer: Medicaid Other | Admitting: *Deleted

## 2023-08-09 ENCOUNTER — Ambulatory Visit: Payer: Medicaid Other | Attending: Obstetrics

## 2023-08-09 VITALS — BP 131/72 | HR 106

## 2023-08-09 DIAGNOSIS — O99213 Obesity complicating pregnancy, third trimester: Secondary | ICD-10-CM | POA: Diagnosis not present

## 2023-08-09 DIAGNOSIS — O99212 Obesity complicating pregnancy, second trimester: Secondary | ICD-10-CM | POA: Diagnosis present

## 2023-08-09 DIAGNOSIS — O99210 Obesity complicating pregnancy, unspecified trimester: Secondary | ICD-10-CM | POA: Diagnosis present

## 2023-08-09 DIAGNOSIS — Z3A29 29 weeks gestation of pregnancy: Secondary | ICD-10-CM

## 2023-08-09 DIAGNOSIS — O288 Other abnormal findings on antenatal screening of mother: Secondary | ICD-10-CM

## 2023-08-09 DIAGNOSIS — O99810 Abnormal glucose complicating pregnancy: Secondary | ICD-10-CM | POA: Diagnosis not present

## 2023-09-13 ENCOUNTER — Ambulatory Visit: Admitting: *Deleted

## 2023-09-13 ENCOUNTER — Ambulatory Visit: Payer: Medicaid Other | Attending: Obstetrics and Gynecology

## 2023-09-13 ENCOUNTER — Other Ambulatory Visit: Payer: Self-pay | Admitting: Maternal & Fetal Medicine

## 2023-09-13 DIAGNOSIS — O99810 Abnormal glucose complicating pregnancy: Secondary | ICD-10-CM | POA: Diagnosis not present

## 2023-09-13 DIAGNOSIS — O281 Abnormal biochemical finding on antenatal screening of mother: Secondary | ICD-10-CM | POA: Diagnosis not present

## 2023-09-13 DIAGNOSIS — O99213 Obesity complicating pregnancy, third trimester: Secondary | ICD-10-CM

## 2023-09-13 DIAGNOSIS — Z3A34 34 weeks gestation of pregnancy: Secondary | ICD-10-CM

## 2023-09-13 NOTE — Procedures (Signed)
 Sonya Collins 2001/05/16 [redacted]w[redacted]d  Fetus A Non-Stress Test Interpretation for 09/13/23  Indication:  Obesity-unable to complete BPP  Fetal Heart Rate A Mode: External Baseline Rate (A): 150 bpm Variability: Moderate Accelerations: 15 x 15 Decelerations: Variable Multiple birth?: No  Uterine Activity Mode: Palpation, Toco Contraction Frequency (min): none Resting Tone Palpated: Relaxed  Interpretation (Fetal Testing) Nonstress Test Interpretation: Reactive Overall Impression: Reassuring for gestational age Comments: Dr. Parke Poisson reviewed tracing

## 2023-09-15 ENCOUNTER — Ambulatory Visit: Admitting: Obstetrics & Gynecology

## 2023-09-15 ENCOUNTER — Other Ambulatory Visit (HOSPITAL_COMMUNITY)
Admission: RE | Admit: 2023-09-15 | Discharge: 2023-09-15 | Disposition: A | Discharge status: Home / Self Care | Source: Ambulatory Visit | Attending: Obstetrics & Gynecology | Admitting: Obstetrics & Gynecology

## 2023-09-15 VITALS — BP 121/79 | HR 97 | Wt 385.0 lb

## 2023-09-15 DIAGNOSIS — Z3480 Encounter for supervision of other normal pregnancy, unspecified trimester: Secondary | ICD-10-CM

## 2023-09-15 NOTE — Progress Notes (Signed)
 Pt would like self swab today for ?yeast.
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
## 2023-09-16 LAB — CERVICOVAGINAL ANCILLARY ONLY
Bacterial Vaginitis (gardnerella): NEGATIVE
Candida Glabrata: NEGATIVE
Candida Vaginitis: NEGATIVE
Comment: NEGATIVE
Comment: NEGATIVE
Comment: NEGATIVE
Comment: NEGATIVE
Trichomonas: NEGATIVE

## 2023-09-20 ENCOUNTER — Ambulatory Visit: Attending: Obstetrics and Gynecology | Admitting: *Deleted

## 2023-09-20 ENCOUNTER — Encounter: Payer: Self-pay | Admitting: Obstetrics & Gynecology

## 2023-09-20 ENCOUNTER — Other Ambulatory Visit: Payer: Medicaid Other

## 2023-09-20 DIAGNOSIS — O99213 Obesity complicating pregnancy, third trimester: Secondary | ICD-10-CM | POA: Diagnosis not present

## 2023-09-20 DIAGNOSIS — Z3A35 35 weeks gestation of pregnancy: Secondary | ICD-10-CM | POA: Diagnosis not present

## 2023-09-20 NOTE — Procedures (Signed)
 Sonya Collins 2001/01/18 [redacted]w[redacted]d  Fetus A Non-Stress Test Interpretation for 09/20/23  Indication:  Obesity  Fetal Heart Rate A Mode: External Baseline Rate (A): 155 bpm Variability: Moderate Accelerations: 15 x 15 Decelerations: None Multiple birth?: No  Uterine Activity Mode: Palpation, Toco Contraction Frequency (min): none Resting Tone Palpated: Relaxed  Interpretation (Fetal Testing) Nonstress Test Interpretation: Reactive Overall Impression: Reassuring for gestational age Comments: Dr. Parke Poisson reviewed tracing

## 2023-09-27 ENCOUNTER — Ambulatory Visit: Payer: Medicaid Other | Attending: Maternal & Fetal Medicine | Admitting: *Deleted

## 2023-09-27 VITALS — BP 138/74

## 2023-09-27 DIAGNOSIS — Z3A36 36 weeks gestation of pregnancy: Secondary | ICD-10-CM | POA: Diagnosis not present

## 2023-09-27 DIAGNOSIS — O99213 Obesity complicating pregnancy, third trimester: Secondary | ICD-10-CM | POA: Diagnosis not present

## 2023-09-27 NOTE — Procedures (Signed)
 Sonya Collins October 17, 2000 [redacted]w[redacted]d  Fetus A Non-Stress Test Interpretation for 09/27/23 (NST only)  Indication:  Morbid obesity (BMI 61)  Fetal Heart Rate A Mode: External Baseline Rate (A): 155 bpm Variability: Moderate Accelerations: 15 x 15 Decelerations: None Multiple birth?: No  Uterine Activity Mode: Palpation, Toco Contraction Frequency (min): None Resting Tone Palpated: Relaxed Resting Time: Adequate  Interpretation (Fetal Testing) Nonstress Test Interpretation: Reactive Comments: Dr. Parke Poisson reviewed tracing.

## 2023-09-29 ENCOUNTER — Encounter: Admitting: Medical

## 2023-09-30 ENCOUNTER — Other Ambulatory Visit (HOSPITAL_COMMUNITY)
Admission: RE | Admit: 2023-09-30 | Discharge: 2023-09-30 | Disposition: A | Discharge status: Home / Self Care | Source: Ambulatory Visit | Attending: Certified Nurse Midwife | Admitting: Certified Nurse Midwife

## 2023-09-30 ENCOUNTER — Ambulatory Visit (INDEPENDENT_AMBULATORY_CARE_PROVIDER_SITE_OTHER): Admitting: Certified Nurse Midwife

## 2023-09-30 VITALS — BP 132/87 | HR 94 | Wt 395.7 lb

## 2023-09-30 DIAGNOSIS — O0993 Supervision of high risk pregnancy, unspecified, third trimester: Secondary | ICD-10-CM | POA: Diagnosis present

## 2023-09-30 DIAGNOSIS — Z3A36 36 weeks gestation of pregnancy: Secondary | ICD-10-CM | POA: Insufficient documentation
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
## 2023-09-30 NOTE — Progress Notes (Unsigned)
 Pt presents for rob. Pt has no questions or concerns at this time.

## 2023-10-01 ENCOUNTER — Telehealth (HOSPITAL_COMMUNITY): Payer: Self-pay | Admitting: *Deleted

## 2023-10-01 ENCOUNTER — Other Ambulatory Visit: Payer: Self-pay | Admitting: Certified Nurse Midwife

## 2023-10-01 ENCOUNTER — Encounter (HOSPITAL_COMMUNITY): Payer: Self-pay | Admitting: *Deleted

## 2023-10-01 NOTE — Telephone Encounter (Signed)
 Preadmission screen

## 2023-10-04 ENCOUNTER — Other Ambulatory Visit: Payer: Self-pay

## 2023-10-04 ENCOUNTER — Ambulatory Visit: Payer: Medicaid Other | Attending: Obstetrics and Gynecology | Admitting: *Deleted

## 2023-10-04 DIAGNOSIS — O99213 Obesity complicating pregnancy, third trimester: Secondary | ICD-10-CM | POA: Diagnosis present

## 2023-10-04 DIAGNOSIS — Z3A37 37 weeks gestation of pregnancy: Secondary | ICD-10-CM | POA: Diagnosis not present

## 2023-10-04 LAB — CERVICOVAGINAL ANCILLARY ONLY
Bacterial Vaginitis (gardnerella): NEGATIVE
Candida Glabrata: NEGATIVE
Candida Vaginitis: NEGATIVE
Chlamydia: NEGATIVE
Comment: NEGATIVE
Comment: NEGATIVE
Comment: NEGATIVE
Comment: NEGATIVE
Comment: NEGATIVE
Comment: NORMAL
Neisseria Gonorrhea: NEGATIVE
Trichomonas: NEGATIVE

## 2023-10-04 LAB — CULTURE, BETA STREP (GROUP B ONLY): Strep Gp B Culture: NEGATIVE

## 2023-10-04 NOTE — Procedures (Signed)
 Kortny Lirette 13-Apr-2001 [redacted]w[redacted]d  Fetus A Non-Stress Test Interpretation for 10/04/23 (NST only)  Indication:  Morbid obesity  Fetal Heart Rate A Mode: External Baseline Rate (A): 155 bpm Variability: Moderate Accelerations: 15 x 15 Decelerations: None Multiple birth?: No  Uterine Activity Mode: Palpation, Toco Contraction Frequency (min): None Resting Tone Palpated: Relaxed Resting Time: Adequate  Interpretation (Fetal Testing) Nonstress Test Interpretation: Reactive Comments: Dr. Judeth Cornfield reviewed tracing.

## 2023-10-07 ENCOUNTER — Ambulatory Visit (INDEPENDENT_AMBULATORY_CARE_PROVIDER_SITE_OTHER): Admitting: Obstetrics and Gynecology

## 2023-10-07 VITALS — BP 134/88 | HR 99 | Wt >= 6400 oz

## 2023-10-07 DIAGNOSIS — Z3A37 37 weeks gestation of pregnancy: Secondary | ICD-10-CM

## 2023-10-07 DIAGNOSIS — O99210 Obesity complicating pregnancy, unspecified trimester: Secondary | ICD-10-CM

## 2023-10-07 DIAGNOSIS — O0993 Supervision of high risk pregnancy, unspecified, third trimester: Secondary | ICD-10-CM
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
## 2023-10-07 NOTE — Progress Notes (Signed)
 Pt had NST on Monday.

## 2023-10-08 ENCOUNTER — Other Ambulatory Visit: Payer: Self-pay

## 2023-10-08 DIAGNOSIS — R7309 Other abnormal glucose: Secondary | ICD-10-CM

## 2023-10-08 LAB — CBC
Hematocrit: 35.4 % (ref 34.0–46.6)
Hemoglobin: 11.2 g/dL (ref 11.1–15.9)
MCH: 26 pg — ABNORMAL LOW (ref 26.6–33.0)
MCHC: 31.6 g/dL (ref 31.5–35.7)
MCV: 82 fL (ref 79–97)
Platelets: 336 10*3/uL (ref 150–450)
RBC: 4.3 x10E6/uL (ref 3.77–5.28)
RDW: 15.8 % — ABNORMAL HIGH (ref 11.7–15.4)
WBC: 6 10*3/uL (ref 3.4–10.8)

## 2023-10-08 LAB — RPR: RPR Ser Ql: NONREACTIVE

## 2023-10-08 LAB — HIV ANTIBODY (ROUTINE TESTING W REFLEX): HIV Screen 4th Generation wRfx: NONREACTIVE

## 2023-10-08 LAB — HEMOGLOBIN A1C
Est. average glucose Bld gHb Est-mCnc: 137 mg/dL
Hgb A1c MFr Bld: 6.4 % — ABNORMAL HIGH (ref 4.8–5.6)

## 2023-10-08 MED ORDER — ACCU-CHEK GUIDE W/DEVICE KIT: 1.0000 | PACK | Freq: Four times a day (QID) | 0 refills | Status: DC

## 2023-10-08 MED ORDER — ACCU-CHEK GUIDE TEST VI STRP: ORAL_STRIP | 12 refills | Status: DC

## 2023-10-08 MED ORDER — ACCU-CHEK SOFTCLIX LANCETS MISC: 12 refills | Status: DC

## 2023-10-08 NOTE — Progress Notes (Signed)
 GDM supplies and referral sent in, for elevated A1C per Dr. Shea Evans. Pt missed 2 hour gtt

## 2023-10-11 ENCOUNTER — Other Ambulatory Visit: Payer: Self-pay

## 2023-10-11 ENCOUNTER — Ambulatory Visit (HOSPITAL_BASED_OUTPATIENT_CLINIC_OR_DEPARTMENT_OTHER): Admitting: Maternal & Fetal Medicine

## 2023-10-11 ENCOUNTER — Ambulatory Visit (HOSPITAL_BASED_OUTPATIENT_CLINIC_OR_DEPARTMENT_OTHER): Payer: Medicaid Other

## 2023-10-11 ENCOUNTER — Inpatient Hospital Stay (HOSPITAL_COMMUNITY)
Admission: AD | Admit: 2023-10-11 | Discharge: 2023-10-15 | DRG: 806 | Disposition: A | Discharge status: Home / Self Care | Attending: Obstetrics and Gynecology | Admitting: Obstetrics and Gynecology

## 2023-10-11 ENCOUNTER — Encounter (HOSPITAL_COMMUNITY): Payer: Self-pay | Admitting: Obstetrics & Gynecology

## 2023-10-11 DIAGNOSIS — Z3A38 38 weeks gestation of pregnancy: Secondary | ICD-10-CM

## 2023-10-11 DIAGNOSIS — N179 Acute kidney failure, unspecified: Secondary | ICD-10-CM | POA: Diagnosis not present

## 2023-10-11 DIAGNOSIS — O134 Gestational [pregnancy-induced] hypertension without significant proteinuria, complicating childbirth: Principal | ICD-10-CM | POA: Diagnosis present

## 2023-10-11 DIAGNOSIS — R030 Elevated blood-pressure reading, without diagnosis of hypertension: Secondary | ICD-10-CM | POA: Insufficient documentation

## 2023-10-11 DIAGNOSIS — R7303 Prediabetes: Secondary | ICD-10-CM | POA: Diagnosis present

## 2023-10-11 DIAGNOSIS — O99892 Other specified diseases and conditions complicating childbirth: Secondary | ICD-10-CM | POA: Diagnosis present

## 2023-10-11 DIAGNOSIS — Z6841 Body Mass Index (BMI) 40.0 and over, adult: Secondary | ICD-10-CM | POA: Insufficient documentation

## 2023-10-11 DIAGNOSIS — O99213 Obesity complicating pregnancy, third trimester: Secondary | ICD-10-CM

## 2023-10-11 DIAGNOSIS — O133 Gestational [pregnancy-induced] hypertension without significant proteinuria, third trimester: Secondary | ICD-10-CM

## 2023-10-11 DIAGNOSIS — Z833 Family history of diabetes mellitus: Secondary | ICD-10-CM | POA: Diagnosis not present

## 2023-10-11 DIAGNOSIS — O99214 Obesity complicating childbirth: Secondary | ICD-10-CM | POA: Diagnosis present

## 2023-10-11 DIAGNOSIS — Z3490 Encounter for supervision of normal pregnancy, unspecified, unspecified trimester: Secondary | ICD-10-CM | POA: Diagnosis present

## 2023-10-11 DIAGNOSIS — O9049 Other postpartum acute kidney failure: Secondary | ICD-10-CM | POA: Diagnosis not present

## 2023-10-11 DIAGNOSIS — Z87891 Personal history of nicotine dependence: Secondary | ICD-10-CM | POA: Diagnosis not present

## 2023-10-11 DIAGNOSIS — O34219 Maternal care for unspecified type scar from previous cesarean delivery: Secondary | ICD-10-CM | POA: Diagnosis not present

## 2023-10-11 DIAGNOSIS — Z3480 Encounter for supervision of other normal pregnancy, unspecified trimester: Principal | ICD-10-CM

## 2023-10-11 DIAGNOSIS — O139 Gestational [pregnancy-induced] hypertension without significant proteinuria, unspecified trimester: Secondary | ICD-10-CM | POA: Insufficient documentation

## 2023-10-11 DIAGNOSIS — O99344 Other mental disorders complicating childbirth: Secondary | ICD-10-CM | POA: Diagnosis not present

## 2023-10-11 DIAGNOSIS — T7422XA Child sexual abuse, confirmed, initial encounter: Secondary | ICD-10-CM | POA: Diagnosis present

## 2023-10-11 DIAGNOSIS — Z8249 Family history of ischemic heart disease and other diseases of the circulatory system: Secondary | ICD-10-CM

## 2023-10-11 DIAGNOSIS — Z7982 Long term (current) use of aspirin: Secondary | ICD-10-CM | POA: Diagnosis not present

## 2023-10-11 HISTORY — DX: Gestational (pregnancy-induced) hypertension without significant proteinuria, unspecified trimester: O13.9

## 2023-10-11 LAB — COMPREHENSIVE METABOLIC PANEL WITH GFR
ALT: 15 U/L (ref 0–44)
AST: 17 U/L (ref 15–41)
Albumin: 2.1 g/dL — ABNORMAL LOW (ref 3.5–5.0)
Alkaline Phosphatase: 78 U/L (ref 38–126)
Anion gap: 7 (ref 5–15)
BUN: 7 mg/dL (ref 6–20)
CO2: 21 mmol/L — ABNORMAL LOW (ref 22–32)
Calcium: 8.9 mg/dL (ref 8.9–10.3)
Chloride: 107 mmol/L (ref 98–111)
Creatinine, Ser: 0.54 mg/dL (ref 0.44–1.00)
GFR, Estimated: >60 mL/min (ref >60)
Glucose, Bld: 112 mg/dL — ABNORMAL HIGH (ref 70–99)
Potassium: 4 mmol/L (ref 3.5–5.1)
Sodium: 135 mmol/L (ref 135–145)
Total Bilirubin: 0.3 mg/dL (ref 0.0–1.2)
Total Protein: 5.9 g/dL — ABNORMAL LOW (ref 6.5–8.1)

## 2023-10-11 LAB — CBC
HCT: 33.1 % — ABNORMAL LOW (ref 36.0–46.0)
Hemoglobin: 10.9 g/dL — ABNORMAL LOW (ref 12.0–15.0)
MCH: 26.8 pg (ref 26.0–34.0)
MCHC: 32.9 g/dL (ref 30.0–36.0)
MCV: 81.5 fL (ref 80.0–100.0)
Platelets: 298 10*3/uL (ref 150–400)
RBC: 4.06 MIL/uL (ref 3.87–5.11)
RDW: 16.4 % — ABNORMAL HIGH (ref 11.5–15.5)
WBC: 6.2 10*3/uL (ref 4.0–10.5)
nRBC: 0 % (ref 0.0–0.2)

## 2023-10-11 LAB — TYPE AND SCREEN
ABO/RH(D): A POS
Antibody Screen: NEGATIVE

## 2023-10-11 LAB — PROTEIN / CREATININE RATIO, URINE
Creatinine, Urine: 101 mg/dL
Protein Creatinine Ratio: 0.12 mg/mg{creat} (ref 0.00–0.15)
Total Protein, Urine: 12 mg/dL

## 2023-10-11 LAB — GLUCOSE, CAPILLARY: Glucose-Capillary: 79 mg/dL (ref 70–99)

## 2023-10-11 MED ORDER — MISOPROSTOL 50MCG HALF TABLET
50.0000 ug | ORAL_TABLET | Freq: Once | ORAL | Status: AC
  Administered 2023-10-11: 50 ug via ORAL
  Filled 2023-10-11: qty 1

## 2023-10-11 MED ORDER — LACTATED RINGERS IV SOLN
500.0000 mL | INTRAVENOUS | Status: DC | PRN
  Administered 2023-10-12 (×2): 500 mL via INTRAVENOUS

## 2023-10-11 MED ORDER — FENTANYL CITRATE (PF) 100 MCG/2ML IJ SOLN
50.0000 ug | INTRAMUSCULAR | Status: DC | PRN
  Administered 2023-10-12: 50 ug via INTRAVENOUS
  Administered 2023-10-12: 100 ug via INTRAVENOUS
  Filled 2023-10-11 (×5): qty 2

## 2023-10-11 MED ORDER — OXYCODONE-ACETAMINOPHEN 5-325 MG PO TABS: 2.0000 | ORAL_TABLET | ORAL | Status: DC | PRN

## 2023-10-11 MED ORDER — OXYTOCIN-SODIUM CHLORIDE 30-0.9 UT/500ML-% IV SOLN
1.0000 m[IU]/min | INTRAVENOUS | Status: DC
  Administered 2023-10-12: 2 m[IU]/min via INTRAVENOUS
  Filled 2023-10-11: qty 500

## 2023-10-11 MED ORDER — FENTANYL-BUPIVACAINE-NACL 0.5-0.125-0.9 MG/250ML-% EP SOLN
12.0000 mL/h | EPIDURAL | Status: DC | PRN
  Administered 2023-10-12: 12 mL/h via EPIDURAL
  Filled 2023-10-11 (×3): qty 250

## 2023-10-11 MED ORDER — MISOPROSTOL 25 MCG QUARTER TABLET
25.0000 ug | ORAL_TABLET | Freq: Once | ORAL | Status: AC
  Administered 2023-10-11: 25 ug via VAGINAL
  Filled 2023-10-11: qty 1

## 2023-10-11 MED ORDER — OXYTOCIN BOLUS FROM INFUSION
333.0000 mL | Freq: Once | INTRAVENOUS | Status: AC
  Administered 2023-10-13: 333 mL via INTRAVENOUS

## 2023-10-11 MED ORDER — LACTATED RINGERS IV SOLN: 500.0000 mL | Freq: Once | INTRAVENOUS | Status: DC

## 2023-10-11 MED ORDER — PHENYLEPHRINE 80 MCG/ML (10ML) SYRINGE FOR IV PUSH (FOR BLOOD PRESSURE SUPPORT)
80.0000 ug | PREFILLED_SYRINGE | INTRAVENOUS | Status: DC | PRN
  Administered 2023-10-12: 80 ug via INTRAVENOUS
  Filled 2023-10-11: qty 10

## 2023-10-11 MED ORDER — ONDANSETRON HCL 4 MG/2ML IJ SOLN
4.0000 mg | Freq: Four times a day (QID) | INTRAMUSCULAR | Status: DC | PRN
  Administered 2023-10-12 – 2023-10-13 (×2): 4 mg via INTRAVENOUS
  Filled 2023-10-11 (×2): qty 2

## 2023-10-11 MED ORDER — OXYCODONE-ACETAMINOPHEN 5-325 MG PO TABS: 1.0000 | ORAL_TABLET | ORAL | Status: DC | PRN

## 2023-10-11 MED ORDER — OXYTOCIN-SODIUM CHLORIDE 30-0.9 UT/500ML-% IV SOLN
2.5000 [IU]/h | INTRAVENOUS | Status: DC
  Administered 2023-10-13: 2.5 [IU]/h via INTRAVENOUS
  Filled 2023-10-11: qty 500

## 2023-10-11 MED ORDER — ACETAMINOPHEN 325 MG PO TABS: 650.0000 mg | ORAL_TABLET | ORAL | Status: DC | PRN

## 2023-10-11 MED ORDER — DIPHENHYDRAMINE HCL 50 MG/ML IJ SOLN: 12.5000 mg | INTRAMUSCULAR | Status: DC | PRN

## 2023-10-11 MED ORDER — EPHEDRINE 5 MG/ML INJ
10.0000 mg | INTRAVENOUS | Status: DC | PRN
  Filled 2023-10-11: qty 5

## 2023-10-11 MED ORDER — TERBUTALINE SULFATE 1 MG/ML IJ SOLN: 0.2500 mg | Freq: Once | INTRAMUSCULAR | Status: DC | PRN

## 2023-10-11 MED ORDER — SOD CITRATE-CITRIC ACID 500-334 MG/5ML PO SOLN: 30.0000 mL | ORAL | Status: DC | PRN

## 2023-10-11 MED ORDER — EPHEDRINE 5 MG/ML INJ: 10.0000 mg | INTRAVENOUS | Status: DC | PRN

## 2023-10-11 MED ORDER — LACTATED RINGERS IV SOLN: INTRAVENOUS | Status: DC

## 2023-10-11 MED ORDER — LIDOCAINE HCL (PF) 1 % IJ SOLN: 30.0000 mL | INTRAMUSCULAR | Status: DC | PRN
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
## 2023-10-11 NOTE — Progress Notes (Signed)
 Patient Vitals for the past 4 hrs:  BP Temp Temp src Pulse  10/11/23 2200 -- 98.3 F (36.8 C) Oral --  10/11/23 2132 124/61 -- -- 89  10/11/23 2030 118/63 -- -- 85  10/11/23 1934 (!) 145/69 98.7 F (37.1 C) Oral 85   Ctx mild, q 1.5-3 minutes. FHR 150-160 w/mod variability, + accels, no decels. Cooks inserted and gradually inflated w/50cc H20.  Cx 1/40/-3.  Will redose w/cytotec if ctx slow down.

## 2023-10-11 NOTE — MAU Note (Signed)
.  Sonya Collins is a 24 y.o. at [redacted]w[redacted]d here in MAU reporting: sent from MFM  for increased swelling.Stated she has some blurred vision and decreased fetal movement. Mild ctx  LMP:  Onset of complaint: today Pain score: 0 Vitals:   10/11/23 1413  BP: 138/89  Pulse: 99  Resp: 18  Temp: 98.4 F (36.9 C)     FHT:   Lab orders placed from triage:
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
## 2023-10-12 ENCOUNTER — Inpatient Hospital Stay (HOSPITAL_COMMUNITY): Admitting: Anesthesiology

## 2023-10-12 LAB — CBC
HCT: 35.5 % — ABNORMAL LOW (ref 36.0–46.0)
Hemoglobin: 11.5 g/dL — ABNORMAL LOW (ref 12.0–15.0)
MCH: 26.2 pg (ref 26.0–34.0)
MCHC: 32.4 g/dL (ref 30.0–36.0)
MCV: 80.9 fL (ref 80.0–100.0)
Platelets: 314 10*3/uL (ref 150–400)
RBC: 4.39 MIL/uL (ref 3.87–5.11)
RDW: 16.5 % — ABNORMAL HIGH (ref 11.5–15.5)
WBC: 7.4 10*3/uL (ref 4.0–10.5)
nRBC: 0 % (ref 0.0–0.2)

## 2023-10-12 LAB — GLUCOSE, CAPILLARY
Glucose-Capillary: 104 mg/dL — ABNORMAL HIGH (ref 70–99)
Glucose-Capillary: 89 mg/dL (ref 70–99)
Glucose-Capillary: 91 mg/dL (ref 70–99)
Glucose-Capillary: 93 mg/dL (ref 70–99)
Glucose-Capillary: 97 mg/dL (ref 70–99)

## 2023-10-12 LAB — RPR: RPR Ser Ql: NONREACTIVE

## 2023-10-12 MED ORDER — TERBUTALINE SULFATE 1 MG/ML IJ SOLN: 0.2500 mg | Freq: Once | INTRAMUSCULAR | Status: DC | PRN

## 2023-10-12 MED ORDER — OXYTOCIN-SODIUM CHLORIDE 30-0.9 UT/500ML-% IV SOLN
1.0000 m[IU]/min | INTRAVENOUS | Status: DC
Start: 2023-10-12 — End: 2023-10-13

## 2023-10-12 MED ORDER — BUPIVACAINE HCL (PF) 0.25 % IJ SOLN
INTRAMUSCULAR | Status: DC | PRN
  Administered 2023-10-12: 10 mL via EPIDURAL
  Administered 2023-10-12 – 2023-10-13 (×2): 8 mL via EPIDURAL

## 2023-10-12 MED ORDER — FENTANYL CITRATE (PF) 100 MCG/2ML IJ SOLN
INTRAMUSCULAR | Status: DC | PRN
  Administered 2023-10-12: 100 ug via INTRAVENOUS

## 2023-10-12 MED ORDER — LIDOCAINE HCL (PF) 1 % IJ SOLN
INTRAMUSCULAR | Status: DC | PRN
  Administered 2023-10-12: 5 mL via EPIDURAL

## 2023-10-12 MED ORDER — BUPIVACAINE HCL (PF) 0.25 % IJ SOLN
INTRAMUSCULAR | Status: DC | PRN
  Administered 2023-10-12: 1.3 mL via INTRATHECAL

## 2023-10-12 MED ORDER — FENTANYL CITRATE (PF) 100 MCG/2ML IJ SOLN
INTRAMUSCULAR | Status: DC | PRN
  Administered 2023-10-12 – 2023-10-13 (×2): 100 ug via EPIDURAL

## 2023-10-12 NOTE — Progress Notes (Signed)
 Labor Progress Note Sanjuana Mruk is a 23 y.o. G1P0000 at [redacted]w[redacted]d presented for IOL for gHTN S: Comfortable with re-dose of epidural.   O:  BP 128/88   Pulse (!) 106   Temp 98.9 F (37.2 C) (Axillary)   Resp 19   Ht 5\' 4"  (1.626 m)   Wt (!) 181.9 kg   LMP 01/10/2023   SpO2 97%   BMI 68.83 kg/m   EFM: baseline 160, accels, no decels, moderate variability TOCO: q3-31min contractions  CVE: Dilation: 5.5 Effacement (%): 80 Cervical Position: Middle Station: -2 Presentation: Vertex Exam by:: Aleta Hutch RN   A&P: 23 y.o. G1P0000 [redacted]w[redacted]d admitted for IOL for gHTN #Labor: Progressing very slowly. Minimal position changes today due to patient preference. Pitocin being titrated to adequate MVUs; intermittently adequate today with slow change from 4-5.5cm. Will continue to titrate and reposition as patient allows. Ruptured and on Pitocin since 4/15 @0600 .  #Pain: Epidural #FWB: Cat II; fetal tachycardia otherwise reassuring. Afebrile.  #GBS negative  #gHTN Today's Vitals   10/12/23 2100 10/12/23 2107 10/12/23 2112 10/12/23 2202  BP: 123/65 (!) 142/92 120/69 128/88  Pulse: 91 93 90 (!) 106  Resp:      Temp:      TempSrc:      SpO2:      Weight:      Height:      PainSc:       Body mass index is 68.83 kg/m.  PreE labs wnl  Darrow End, MD 10:20 PM

## 2023-10-12 NOTE — Progress Notes (Signed)
 This RN attempted to reposition pt multiple times for adequate mobility with epidural, pt refusing due to pain/ pressure in pelvic area causing her to not reposition. PCA button pressed numerous times. Pt educated on benefits of frequent position changes to help with pain. Pt declines at this time. Will continue to assess pain and willingness to reposition.

## 2023-10-12 NOTE — Anesthesia Procedure Notes (Signed)
 Epidural Patient location during procedure: OB Start time: 10/12/2023 8:40 PM End time: 10/12/2023 8:54 PM  Staffing Anesthesiologist: Jacquelyne Matte, DO Performed: anesthesiologist   Preanesthetic Checklist Completed: patient identified, IV checked, risks and benefits discussed, monitors and equipment checked, pre-op evaluation and timeout performed  Epidural Patient position: sitting Prep: DuraPrep and site prepped and draped Patient monitoring: continuous pulse ox, blood pressure, heart rate and cardiac monitor Approach: midline Location: L4-L5 Injection technique: LOR air  Needle:  Needle type: Tuohy  Needle gauge: 17 G Needle length: 9 cm Needle insertion depth: 10 cm Catheter type: closed end flexible Catheter size: 19 Gauge Catheter at skin depth: 15 cm Test dose: negative  Assessment Sensory level: T8 Events: blood not aspirated, no cerebrospinal fluid, injection not painful, no injection resistance, no paresthesia and negative IV test  Additional Notes Patient identified. Risks/Benefits/Options discussed with patient including but not limited to bleeding, infection, nerve damage, paralysis, failed block, incomplete pain control, headache, blood pressure changes, nausea, vomiting, reactions to medication both or allergic, itching and postpartum back pain. Confirmed with bedside nurse the patient's most recent platelet count. Confirmed with patient that they are not currently taking any anticoagulation, have any bleeding history or any family history of bleeding disorders. Patient expressed understanding and wished to proceed. All questions were answered. Sterile technique was used throughout the entire procedure. Please see nursing notes for vital signs. Test dose was given through epidural catheter and negative prior to continuing to dose epidural or start infusion. Warning signs of high block given to the patient including shortness of breath, tingling/numbness in  hands, complete motor block, or any concerning symptoms with instructions to call for help. Patient was given instructions on fall risk and not to get out of bed. All questions and concerns addressed with instructions to call with any issues or inadequate analgesia.    Single pass epidural one level below prior epidural. CSE performed with 24G sprotte through tuohy, clear CSF, no issues. Pt very comfortable with spinal.Reason for block:procedure for pain

## 2023-10-12 NOTE — Progress Notes (Addendum)
 Patient Vitals for the past 4 hrs:  BP Pulse SpO2  10/12/23 0305 (!) 121/48 87 99 %  10/12/23 0300 (!) 113/38 (!) 158 100 %  10/12/23 0254 (!) 96/47 95 --  10/12/23 0252 (!) 76/51 91 --  10/12/23 0247 (!) 109/51 93 --  10/12/23 0023 (!) 139/57 98 --   Comfortable w/epidural.  Cooks out around 0130. FHR Cat 1, mild and irregular ctx. Cx 4- 5/50/-2.  AROM w/clear fluid. IUPC placed.  Will start pitocin

## 2023-10-12 NOTE — Progress Notes (Signed)
 Brief labor progress note:   Comfortable at bedside w epidural. No questions at this time. Cat I strip. MVU adequate on current dose of Pitocin. Continue current plan.  BP normotensive at present.   Melanie Spires, MD OB Fellow, Faculty Practice [facility], Center for [facility]

## 2023-10-12 NOTE — Anesthesia Preprocedure Evaluation (Addendum)
 Anesthesia Evaluation  Patient identified by MRN, date of birth, ID band Patient awake    Reviewed: Allergy & Precautions, NPO status , Patient's Chart, lab work & pertinent test results  Airway Mallampati: III  TM Distance: >3 FB Neck ROM: Full    Dental no notable dental hx. (+) Teeth Intact, Dental Advisory Given   Pulmonary former smoker   Pulmonary exam normal        Cardiovascular hypertension (ghtn), Normal cardiovascular exam Rhythm:Regular Rate:Normal     Neuro/Psych  PSYCHIATRIC DISORDERS Anxiety Depression       GI/Hepatic   Endo/Other  diabetes, Gestational  Class 4 obesity  Renal/GU      Musculoskeletal   Abdominal   Peds  Hematology Lab Results      Component                Value               Date                      WBC                      7.4                 10/12/2023                HGB                      11.5 (L)            10/12/2023                HCT                      35.5 (L)            10/12/2023                MCV                      80.9                10/12/2023                PLT                      314                 10/12/2023              Anesthesia Other Findings   Reproductive/Obstetrics (+) Pregnancy                             Anesthesia Physical Anesthesia Plan  ASA: 3  Anesthesia Plan: Epidural   Post-op Pain Management:    Induction:   PONV Risk Score and Plan:   Airway Management Planned:   Additional Equipment:   Intra-op Plan:   Post-operative Plan:   Informed Consent: I have reviewed the patients History and Physical, chart, labs and discussed the procedure including the risks, benefits and alternatives for the proposed anesthesia with the patient or authorized representative who has indicated his/her understanding and acceptance.       Plan Discussed with:   Anesthesia Plan Comments: (38.3 wk primagravida w ghtn,  gDm amd BMI 69 for LEA)  Anesthesia Quick Evaluation

## 2023-10-12 NOTE — Anesthesia Procedure Notes (Signed)
 Epidural Patient location during procedure: OB Start time: 10/12/2023 2:28 AM End time: 10/12/2023 2:46 AM  Staffing Anesthesiologist: Rosalita Combe, MD Performed: anesthesiologist   Preanesthetic Checklist Completed: patient identified, IV checked, site marked, risks and benefits discussed, surgical consent, monitors and equipment checked, pre-op evaluation and timeout performed  Epidural Patient position: sitting Prep: DuraPrep and site prepped and draped Patient monitoring: continuous pulse ox and blood pressure Approach: midline Location: L3-L4 Injection technique: LOR air  Needle:  Needle type: Tuohy  Needle gauge: 17 G Needle length: 9 cm and 9 Needle insertion depth: 9 cm Catheter type: closed end flexible Catheter size: 19 Gauge Catheter at skin depth: 10 and 16 cm Test dose: negative  Assessment Events: blood not aspirated, no cerebrospinal fluid, injection not painful, no injection resistance, no paresthesia and negative IV test  Additional Notes Patient identified. Risks/Benefits/Options discussed with patient including but not limited to bleeding, infection, nerve damage, paralysis, failed block, incomplete pain control, headache, blood pressure changes, nausea, vomiting, reactions to medication both or allergic, itching and postpartum back pain. Confirmed with bedside nurse the patient's most recent platelet count. Confirmed with patient that they are not currently taking any anticoagulation, have any bleeding history or any family history of bleeding disorders. Patient expressed understanding and wished to proceed. All questions were answered. Sterile technique was used throughout the entire procedure. Please see nursing notes for vital signs. Test dose was given through epidural needle and negative prior to continuing to dose epidural or start infusion. Warning signs of high block given to the patient including shortness of breath, tingling/numbness in hands,  complete motor block, or any concerning symptoms with instructions to call for help. Patient was given instructions on fall risk and not to get out of bed. All questions and concerns addressed with instructions to call with any issues.  1 Attempt (S) . Patient tolerated procedure well.

## 2023-10-12 NOTE — Progress Notes (Addendum)
 Brief labor progress note:  Continues to make slow progress -- cx now 5.5cm. Encouraged position changes w RN. Cat I tracing, thoguh no accels but overall reassuring. Continues to have adequate ctx, will cont Pitocin and titrate as needed.   BP have been mild range, no severe range BP noted. No sxs reported.   CBG have been ok all day.  Melanie Spires, MD OB Fellow, Faculty Practice [facility], Center for [facility]

## 2023-10-12 NOTE — Progress Notes (Signed)
 Brief labor progress note:  Pt reports increased pressure, describes it specifically in vaginal area. She has a cat I strip. On Pitocin and adequate. Making slow change, reassured pt that latent labor sometimes a lengthier process. Will cont current plan.  BP normotensive at present.   Melanie Spires, MD OB Fellow, Faculty Practice [facility], Center for [facility]

## 2023-10-12 NOTE — Progress Notes (Signed)
 LABOR PROGRESS NOTE  Patient Name: Sonya Collins, female   DOB: 2000/10/25, 23 y.o.  MRN: [id number]  Trial 2 hour pitocin break given significant maternal discomfort and need for multiple epidurals, and minimal progression without worsening contraction frequency despite up titration of pitocin.   Will restart at midnight at 2u, and titrate 2x2 to adequate. Plan to recheck after 4-6 hours pending adequate MVUs.   Darrow End, MD

## 2023-10-13 ENCOUNTER — Encounter: Admitting: Obstetrics

## 2023-10-13 ENCOUNTER — Encounter (HOSPITAL_COMMUNITY): Payer: Self-pay | Admitting: Family Medicine

## 2023-10-13 DIAGNOSIS — Z3A38 38 weeks gestation of pregnancy: Secondary | ICD-10-CM

## 2023-10-13 DIAGNOSIS — O134 Gestational [pregnancy-induced] hypertension without significant proteinuria, complicating childbirth: Secondary | ICD-10-CM

## 2023-10-13 DIAGNOSIS — O99344 Other mental disorders complicating childbirth: Secondary | ICD-10-CM

## 2023-10-13 LAB — CBC
HCT: 35.5 % — ABNORMAL LOW (ref 36.0–46.0)
HCT: 33 % — ABNORMAL LOW (ref 36.0–46.0)
Hemoglobin: 11.7 g/dL — ABNORMAL LOW (ref 12.0–15.0)
Hemoglobin: 10.6 g/dL — ABNORMAL LOW (ref 12.0–15.0)
MCH: 26.8 pg (ref 26.0–34.0)
MCH: 26.4 pg (ref 26.0–34.0)
MCHC: 33 g/dL (ref 30.0–36.0)
MCHC: 32.1 g/dL (ref 30.0–36.0)
MCV: 81.2 fL (ref 80.0–100.0)
MCV: 82.1 fL (ref 80.0–100.0)
Platelets: 297 10*3/uL (ref 150–400)
Platelets: 290 10*3/uL (ref 150–400)
RBC: 4.37 MIL/uL (ref 3.87–5.11)
RBC: 4.02 MIL/uL (ref 3.87–5.11)
RDW: 16.7 % — ABNORMAL HIGH (ref 11.5–15.5)
RDW: 17 % — ABNORMAL HIGH (ref 11.5–15.5)
WBC: 15.8 10*3/uL — ABNORMAL HIGH (ref 4.0–10.5)
WBC: 16.3 10*3/uL — ABNORMAL HIGH (ref 4.0–10.5)
nRBC: 0 % (ref 0.0–0.2)
nRBC: 0 % (ref 0.0–0.2)

## 2023-10-13 LAB — BASIC METABOLIC PANEL WITH GFR
Anion gap: 10 (ref 5–15)
BUN: 11 mg/dL (ref 6–20)
CO2: 20 mmol/L — ABNORMAL LOW (ref 22–32)
Calcium: 8.5 mg/dL — ABNORMAL LOW (ref 8.9–10.3)
Chloride: 107 mmol/L (ref 98–111)
Creatinine, Ser: 0.95 mg/dL (ref 0.44–1.00)
GFR, Estimated: >60 mL/min (ref >60)
Glucose, Bld: 117 mg/dL — ABNORMAL HIGH (ref 70–99)
Potassium: 3.1 mmol/L — ABNORMAL LOW (ref 3.5–5.1)
Sodium: 137 mmol/L (ref 135–145)

## 2023-10-13 LAB — COMPREHENSIVE METABOLIC PANEL WITH GFR
ALT: 28 U/L (ref 0–44)
AST: 41 U/L (ref 15–41)
Albumin: 2.1 g/dL — ABNORMAL LOW (ref 3.5–5.0)
Alkaline Phosphatase: 96 U/L (ref 38–126)
Anion gap: 11 (ref 5–15)
BUN: 12 mg/dL (ref 6–20)
CO2: 17 mmol/L — ABNORMAL LOW (ref 22–32)
Calcium: 8.9 mg/dL (ref 8.9–10.3)
Chloride: 109 mmol/L (ref 98–111)
Creatinine, Ser: 1.14 mg/dL — ABNORMAL HIGH (ref 0.44–1.00)
GFR, Estimated: >60 mL/min (ref >60)
Glucose, Bld: 121 mg/dL — ABNORMAL HIGH (ref 70–99)
Potassium: 3.5 mmol/L (ref 3.5–5.1)
Sodium: 137 mmol/L (ref 135–145)
Total Bilirubin: 1.3 mg/dL — ABNORMAL HIGH (ref 0.0–1.2)
Total Protein: 6.1 g/dL — ABNORMAL LOW (ref 6.5–8.1)

## 2023-10-13 MED ORDER — POTASSIUM CHLORIDE CRYS ER 20 MEQ PO TBCR
20.0000 meq | EXTENDED_RELEASE_TABLET | Freq: Every day | ORAL | Status: DC
  Administered 2023-10-14 – 2023-10-15 (×2): 20 meq via ORAL
  Filled 2023-10-13 (×2): qty 1

## 2023-10-13 MED ORDER — SIMETHICONE 80 MG PO CHEW: 80.0000 mg | CHEWABLE_TABLET | ORAL | Status: DC | PRN

## 2023-10-13 MED ORDER — OXYCODONE HCL 5 MG PO TABS: 10.0000 mg | ORAL_TABLET | Freq: Four times a day (QID) | ORAL | Status: DC | PRN

## 2023-10-13 MED ORDER — FUROSEMIDE 20 MG PO TABS
40.0000 mg | ORAL_TABLET | Freq: Every day | ORAL | Status: DC
  Administered 2023-10-14 – 2023-10-15 (×2): 40 mg via ORAL
  Filled 2023-10-13 (×2): qty 2

## 2023-10-13 MED ORDER — PRENATAL MULTIVITAMIN CH
1.0000 | ORAL_TABLET | Freq: Every day | ORAL | Status: DC
  Administered 2023-10-13 – 2023-10-15 (×3): 1 via ORAL
  Filled 2023-10-13 (×3): qty 1

## 2023-10-13 MED ORDER — ACETAMINOPHEN 500 MG PO TABS
1000.0000 mg | ORAL_TABLET | Freq: Three times a day (TID) | ORAL | Status: DC
  Administered 2023-10-13 – 2023-10-15 (×6): 1000 mg via ORAL
  Filled 2023-10-13 (×6): qty 2

## 2023-10-13 MED ORDER — POTASSIUM CHLORIDE CRYS ER 20 MEQ PO TBCR
20.0000 meq | EXTENDED_RELEASE_TABLET | Freq: Every day | ORAL | Status: DC
  Administered 2023-10-13: 20 meq via ORAL
  Filled 2023-10-13: qty 1

## 2023-10-13 MED ORDER — CYCLOBENZAPRINE HCL 5 MG PO TABS
10.0000 mg | ORAL_TABLET | Freq: Once | ORAL | Status: AC
  Administered 2023-10-13: 10 mg via ORAL
  Filled 2023-10-13: qty 2

## 2023-10-13 MED ORDER — IBUPROFEN 800 MG PO TABS
800.0000 mg | ORAL_TABLET | Freq: Three times a day (TID) | ORAL | Status: DC
  Administered 2023-10-13 – 2023-10-15 (×6): 800 mg via ORAL
  Filled 2023-10-13 (×6): qty 1

## 2023-10-13 MED ORDER — BENZOCAINE-MENTHOL 20-0.5 % EX AERO: 1.0000 | INHALATION_SPRAY | CUTANEOUS | Status: DC | PRN

## 2023-10-13 MED ORDER — SENNOSIDES-DOCUSATE SODIUM 8.6-50 MG PO TABS
2.0000 | ORAL_TABLET | Freq: Every day | ORAL | Status: DC
  Administered 2023-10-14 – 2023-10-15 (×2): 2 via ORAL
  Filled 2023-10-13 (×3): qty 2

## 2023-10-13 MED ORDER — ONDANSETRON HCL 4 MG/2ML IJ SOLN: 4.0000 mg | INTRAMUSCULAR | Status: DC | PRN

## 2023-10-13 MED ORDER — FUROSEMIDE 10 MG/ML IJ SOLN
20.0000 mg | Freq: Once | INTRAMUSCULAR | Status: AC
  Administered 2023-10-13: 20 mg via INTRAVENOUS
  Filled 2023-10-13: qty 2

## 2023-10-13 MED ORDER — OXYCODONE HCL 5 MG PO TABS: 5.0000 mg | ORAL_TABLET | Freq: Four times a day (QID) | ORAL | Status: DC | PRN

## 2023-10-13 MED ORDER — MEDROXYPROGESTERONE ACETATE 150 MG/ML IM SUSP: 150.0000 mg | INTRAMUSCULAR | Status: DC | PRN

## 2023-10-13 MED ORDER — NIFEDIPINE ER OSMOTIC RELEASE 30 MG PO TB24
30.0000 mg | ORAL_TABLET | Freq: Every day | ORAL | Status: DC
  Administered 2023-10-13 – 2023-10-14 (×2): 30 mg via ORAL
  Filled 2023-10-13 (×2): qty 1

## 2023-10-13 MED ORDER — FUROSEMIDE 20 MG PO TABS: 20.0000 mg | ORAL_TABLET | Freq: Every day | ORAL | Status: DC

## 2023-10-13 MED ORDER — ONDANSETRON HCL 4 MG PO TABS: 4.0000 mg | ORAL_TABLET | ORAL | Status: DC | PRN

## 2023-10-13 MED ORDER — ZOLPIDEM TARTRATE 5 MG PO TABS: 5.0000 mg | ORAL_TABLET | Freq: Every evening | ORAL | Status: DC | PRN

## 2023-10-13 MED ORDER — DIBUCAINE (PERIANAL) 1 % EX OINT: 1.0000 | TOPICAL_OINTMENT | CUTANEOUS | Status: DC | PRN

## 2023-10-13 MED ORDER — FUROSEMIDE 20 MG PO TABS
20.0000 mg | ORAL_TABLET | Freq: Every day | ORAL | Status: DC
  Filled 2023-10-13: qty 1

## 2023-10-13 MED ORDER — COCONUT OIL OIL
1.0000 | TOPICAL_OIL | Status: DC | PRN
  Administered 2023-10-13: 1 via TOPICAL

## 2023-10-13 MED ORDER — WITCH HAZEL-GLYCERIN EX PADS: 1.0000 | MEDICATED_PAD | CUTANEOUS | Status: DC | PRN

## 2023-10-13 MED ORDER — DIPHENHYDRAMINE HCL 25 MG PO CAPS: 25.0000 mg | ORAL_CAPSULE | Freq: Four times a day (QID) | ORAL | Status: DC | PRN

## 2023-10-13 NOTE — Progress Notes (Signed)
 LABOR PROGRESS NOTE  Patient Name: Sonya Collins, female   DOB: 2000/07/19, 23 y.o.  MRN: [id number]  Pt comfortable with pitocin off. Agreeable to restart pitocin. SVE 8/70/-1.  EFM: baseline 160, accels, no decels, moderate variability TOCO: q6-73min contractions   Darrow End, MD

## 2023-10-13 NOTE — Lactation Note (Signed)
 This note was copied from a baby's chart. Lactation Consultation Note  Patient Name: Sonya Collins [id number] Date: 10/13/2023 Age:23 years  Reason for consult: Initial assessment;Primapara;1st time breastfeeding;Early term 37-38.6wks;Maternal endocrine disorder  P1, [redacted]w[redacted]d, probable GDM ( OB advised to manage as GDM, mother did not complete PN testing), Infant's serum blood glucose was 32.  Mother receptive to [facility] visit and assistance with breastfeeding and pumping. Mother's grandparents are present and mother of baby reports that she lives with her grandparents. Mother's feeding plan is breastfeeding and formula feeding. She reports she has been leaking colostrum and has been pumping periodically in the last few weeks. She reports expressing up to an 30 ml of colostrum per session. Mother did not save her EBM.   With mother's consent, baby placed in football hold to mother's breast and assisted with latch. After a few attempts, baby latched well and fed rhythmically for 20 minutes with intermittent swallows. This LC was doing breast compression during the feeding to increase intake. Mother was falling asleep most of the feeding and she will need additional reinforcement of teaching. Her grandmother was engaged and attentive. Mother would awaken and ask questions and participate.   Mother informed of the low blood sugar protocol by medical staff. Mother would like to supplement with formula, as needed. She wanted to pump, as well. Instructed mother on the use, frequency, cleaning of breast pump and storage of breast milk. She was fitted with 24 mm flanges and given coconut oil with instructions. Mother pumped after breastfeeding. She did not collect colostrum. Encouraged to continue pumping every 3 hours for stimulation.   Due to mother's fatigue, grandmother was taught paced bottle feeding and baby tolerated 16 ml well. NNP advised mother/family to supplement after breastfeeding until baby has stable  blood sugars and she can latch and breastfeed her baby. White Nfant nipple used and family taught to wash and may reuse for 24 hours, then discard. Will replace bottle nipple as needed.  Mom made aware of O/P services, breastfeeding support groups, and our phone # for post-discharge questions.     Encouraged to latch baby with feeding cues, place baby skin to skin if not latching, and call for assistance with breastfeeding as needed. Breastfeed first followed by formula feeding and pumping. Request assist, as needed. Patient's mother/ baby nurse was updated.  Maternal Data Has patient been taught Hand Expression?: Yes Does the patient have breastfeeding experience prior to this delivery?: No  Feeding Mother's Current Feeding Choice: Breast Milk and Formula  LATCH Score Latch: Repeated attempts needed to sustain latch, nipple held in mouth throughout feeding, stimulation needed to elicit sucking reflex.  Audible Swallowing: Spontaneous and intermittent  Type of Nipple: Everted at rest and after stimulation (left nipple everts, right nipple flat, arola edmatous)  Comfort (Breast/Nipple): Soft / non-tender  Hold (Positioning): Full assist, staff holds infant at breast  LATCH Score: 7   Lactation Tools Discussed/Used Tools: Pump;Flanges;Coconut oil;Bottle Flange Size: 24 Breast pump type: Double-Electric Breast Pump Pump Education: Setup, frequency, and cleaning;Milk Storage Reason for Pumping: mother reports pumping at home, continue pumping for stimulation and supplementation Pumping frequency: every 3 hours for 15 min in initiation setting Pumped volume: 0 mL  Interventions Interventions: Breast feeding basics reviewed;Assisted with latch;Skin to skin;Hand express;Breast compression;Adjust position;Support pillows;Coconut oil;DEBP;Education;CDC milk storage guidelines;CDC Guidelines for Breast Pump Cleaning  Discharge Pump: DEBP;Personal  Consult Status Consult Status:  Follow-up Date: 10/14/23 Follow-up type: In-patient    Gearline Kell M 10/13/2023, 1:12  PM

## 2023-10-13 NOTE — Anesthesia Postprocedure Evaluation (Signed)
 Anesthesia Post Note  Patient: Sonya Collins  Procedure(s) Performed: AN AD HOC LABOR EPIDURAL     Patient location during evaluation: Mother Baby Anesthesia Type: Epidural Level of consciousness: awake and alert and oriented Pain management: satisfactory to patient Vital Signs Assessment: post-procedure vital signs reviewed and stable Respiratory status: respiratory function stable Cardiovascular status: stable Postop Assessment: no headache, no backache, epidural receding, patient able to bend at knees, no signs of nausea or vomiting, adequate PO intake and able to ambulate Anesthetic complications: no   No notable events documented.  Last Vitals:  Vitals:   10/13/23 1215 10/13/23 1600  BP: 139/80 124/69  Pulse: 93 87  Resp: 18 18  Temp: 36.8 C 36.7 C  SpO2: 99% 100%    Last Pain:  Vitals:   10/13/23 1600  TempSrc: Oral  PainSc:    Pain Goal:                   Ethyl Vila

## 2023-10-13 NOTE — Progress Notes (Signed)
 Pt's Cr 0.54>1.14 after severe range pressures, now normotensive.  Will get stat CBC and BMP.  If AKI persist despite nursing reports of self voids that seem to be adequate, then would treat as severe PreE and give Magnesium for 24 hrs.  If resolving will CTM Bps and trend labs in morning.   Candice Chalet, MD Family Medicine - Obstetrics Fellow

## 2023-10-13 NOTE — Progress Notes (Signed)
 LABOR PROGRESS NOTE  Patient Name: Sonya Collins, female   DOB: [birth year], 23 y.o.  MRN: [id number]  Pt sleeping soundly. Contractions q66min. Discussed restarting pitocin; pt agreeable. Cat I. SVE 9/90/0.   Restart pitocin.  Anticipate vaginal delivery  Kensleigh Gates, MD

## 2023-10-13 NOTE — Plan of Care (Signed)
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
## 2023-10-13 NOTE — Progress Notes (Signed)
 LABOR PROGRESS NOTE  Patient Name: Sonya Collins, female   DOB: 05-Jun-2001, 23 y.o.  MRN: [id number]  Pt uncomfortable with contractions since pitocin restarted. Requesting pitocin be discontinued. Not open to discussion. Pitocin discontinued. Discussed that if contractions continue every 5-6 minutes or less then may not need to restart pitocin given progress. But that greater than 6 minutes apart are unlikely to be adequate to have a vaginal delivery. Pt agreeable to Pitocin if contractions space to greater than 8 minutes.   Single spontaneous prolonged to the 80s; improved with repositioning.   Blood pressure (!) 156/82, pulse 100, temperature 98.9 F (37.2 C), temperature source Axillary, resp. rate 19, height 5\' 4"  (1.626 m), weight (!) 181.9 kg, last menstrual period 01/10/2023, SpO2 97%.  Stop pitocin per patient request.  Continue to advocate repositioning.  Anticipate vaginal delivery  Meranda Dechaine, MD

## 2023-10-13 NOTE — Progress Notes (Signed)
 Pt with severe range Bps following delivery.  Asymptomatic.  Having back pain.  Will give 20 Lasix IV and Procardia 30mg  XL PO and Oral K supplementation.  No CMP obtained so ordered one stat.   Candice Chalet, MD Family Medicine - Obstetrics Fellow

## 2023-10-14 LAB — COMPREHENSIVE METABOLIC PANEL WITH GFR
ALT: 26 U/L (ref 0–44)
AST: 29 U/L (ref 15–41)
Albumin: 2 g/dL — ABNORMAL LOW (ref 3.5–5.0)
Alkaline Phosphatase: 78 U/L (ref 38–126)
Anion gap: 6 (ref 5–15)
BUN: 10 mg/dL (ref 6–20)
CO2: 22 mmol/L (ref 22–32)
Calcium: 8.7 mg/dL — ABNORMAL LOW (ref 8.9–10.3)
Chloride: 112 mmol/L — ABNORMAL HIGH (ref 98–111)
Creatinine, Ser: 0.64 mg/dL (ref 0.44–1.00)
GFR, Estimated: >60 mL/min (ref >60)
Glucose, Bld: 91 mg/dL (ref 70–99)
Potassium: 3.7 mmol/L (ref 3.5–5.1)
Sodium: 140 mmol/L (ref 135–145)
Total Bilirubin: 0.5 mg/dL (ref 0.0–1.2)
Total Protein: 5.6 g/dL — ABNORMAL LOW (ref 6.5–8.1)

## 2023-10-14 NOTE — Lactation Note (Addendum)
 This note was copied from a baby's chart. Lactation Consultation Note  Patient Name: Sonya Collins Date: 10/14/2023 Age:21 hours  Reason for consult: Follow-up assessment;Exclusive pumping and bottle feeding;Early term 37-38.6wks. LC set MOB up with DEBP and re-fitted MOB with 30 mm breast flange, MOB was using the DEBP and informed LC she did not have any pain or discomfort with pumping and colostrum was being expressed in the breast flanges.  MOB had expressed 3 mls of colostrum and was still pumping when LC left the room. LC observed MOB has areola edema in both breast, MOB was given breast shells to wear in her bra to help resolve or alleviate areola edema, she will wear then when not sleeping. MOB knows to call [facility] services if she has any questions or concerns.  MOB knows that her EBM is safe for 4 hours at room temperature whereas, formula must be used within 1 hour once open.   MOB's Current Feeding Plan: 1- MOB will continue to feed infant by cues, 8-12 times within 24 hours. 2- MOB knows to offer 15-30 mls per feeding on Day 2 of any EBM first and then formula. 3- MOB will continue to use the DEBP every 3 hours for 15 minutes on initial setting.   Maternal Data    Feeding Mother's Current Feeding Choice: Breast Milk and Formula  LATCH Score  MOB's feeding choice is to "Pump Only " and formula feed infant.                   Lactation Tools Discussed/Used Tools: Pump;Flanges;Shells Flange Size: 30 Breast pump type: Double-Electric Breast Pump Pump Education: Setup, frequency, and cleaning;Milk Storage Reason for Pumping: MOB feeding choice currently is " pumping only" and formula feeding infant. Pumping frequency: MOB will continue to use DEBP every 3 hours for 15 minutes on inital setting. Pumped volume: 3 mL (MOB was still expressing colostrum in breast flanges when LC left the room.)  Interventions Interventions: DEBP;Hand pump;Education  Discharge     Consult Status Consult Status: Follow-up Date: 10/15/23 Follow-up type: In-patient    Sonya Collins 10/14/2023, 9:47 PM

## 2023-10-14 NOTE — Lactation Note (Signed)
 This note was copied from a baby's chart. Lactation Consultation Note  Patient Name: Boy Aqsa Sensabaugh [id number] Date: 10/14/2023 Age:21 hours   MOB informed LC she wants to pump only and not latch infant at the breast. MOB plans to call [facility] services later when ready for assistance with using the DEBP, she has not been pumping due to pain. MOB tried pumping once but she doesn't know how to use the pump. LC explained pumping should not be painful. When MOB calls LC services LC will look at the flange size,  how many decimals pump is dial to and how long she is pumping for. LC discussed pumping every 3 hours for 15 minutes to help stimulate and establish her milk supply. [facility] written name on the [employer] in MOB's room for her to call for pumping assistance.    Maternal Data    Feeding Nipple Type: Nfant Standard Flow (white)  LATCH Score                    Lactation Tools Discussed/Used    Interventions    Discharge    Consult Status      Pecolia Bourbon 10/14/2023, 6:56 PM

## 2023-10-14 NOTE — Progress Notes (Signed)
 POSTPARTUM PROGRESS NOTE  Post Partum Day 1  Subjective:  Sonya Collins is a 23 y.o. G1P1001 s/p SVD at [redacted]w[redacted]d.  She reports she is doing well. No acute events overnight. She denies any problems with ambulating, voiding or po intake. Denies nausea or vomiting.  Pain is well controlled.  Lochia is equivalent to a period.  Objective: Blood pressure 108/63, pulse 79, temperature 97.8 F (36.6 C), temperature source Oral, resp. rate 16, height 5\' 4"  (1.626 m), weight (!) 181.9 kg, last menstrual period 01/10/2023, SpO2 98%, unknown if currently breastfeeding.  Physical Exam:  General: alert, cooperative and no distress Chest: no respiratory distress Heart:regular rate, distal pulses intact Uterine Fundus: firm, appropriately tender DVT Evaluation: No calf swelling or tenderness Extremities: 2-3+ bilateral pitting edema of lower extremities, stable as compared to previous exams  Skin: warm, dry  Recent Labs    10/13/23 0905 10/13/23 1908  HGB 11.7* 10.6*  HCT 35.5* 33.0*    Assessment/Plan: Sonya Collins is a 23 y.o. G1P1001 s/p SVD at [redacted]w[redacted]d   PPD#1 - Doing well  Routine postpartum care  Gestational hypertension - Had severe range BP immediately postpartum, but these did resolve with Lasix and Procardia (these meds were started per protocol postpartum) and patient has been normotensive over the past 24 hours since initiation of meds. She notably also has resolved AKI -- given elevated Cr did meet severe criteria, however Cr downtrended to normal on repeat. Given overall clinical picture, currently is stable without si/sx pre-e and no longer meets criteria.   Cont Lasix  Holding Procardia for now given borderline BP, will resume based on BP tmo  Prediabetes - Blood glucose on labs has been stable, would consider metformin on discharge  Contraception: POPs Feeding: Formula Dispo: Plan for discharge tomorrow.   LOS: 3 days   Melanie Spires, MD OB Fellow  10/14/2023, 2:02 PM

## 2023-10-14 NOTE — Patient Instructions (Signed)
 Your **Virtual** appointment with Outpatient Lactation is: May 5th at 3:30 pm  Please call [phone] and press 4 for lactation with any outpatient lactation questions or concerns.

## 2023-10-14 NOTE — Progress Notes (Signed)
 CSW received consult for hx of Depression.  CSW met with MOB to offer support and complete assessment. CSW introduced self and explained reason for consult. MOB was welcoming, pleasant, and remained engaged during assessment. CSW and MOB discussed MOB's mental health history. MOB endorsed a history of anxiety and depression dating back to middle school. MOB reported that she is not currently taking any medication to treat anxiety/depression. MOB reported that she is not currently participating in therapy and shared that she doesn't like therapy. MOB denied any current symptoms of anxiety/depression. MOB reported that she last experienced symptoms of anxiety/depression approximately 4-6 years ago and shared that she was cutting at the time. MOB denied any recent self harm behaviors and noted that 4-6 years ago was the last time she participated in self harm behaviors. Per chart review, MOB participated in two [facility] visits during the pregnancy to address symptoms of anxiety/depression and noted resolution of symptoms. CSW inquired about how MOB was feeling emotionally since giving birth, MOB reported that she was feeling good. MOB presented calm and did not demonstrate any acute mental health signs/symptoms. CSW assessed for safety, MOB denied SI, HI, and domestic violence. CSW inquired about MOB's support system, MOB reported that she has a good support system, consisting of her granny, mom, and FOB's mom.   CSW provided education regarding the baby blues period vs. perinatal mood disorders, discussed treatment and gave resources for mental health follow up if concerns arise.  CSW recommends self-evaluation during the postpartum time period using the New Mom Checklist from Postpartum Progress and encouraged MOB to contact a medical professional if symptoms are noted at any time.    CSW provided review of Sudden Infant Death Syndrome (SIDS) precautions. MOB verbalized understanding and reported having all items  needed to care for infant including a car seat and bassinet.   CSW inquired about any needed resources/supports, MOB reported no needs.   CSW identifies no further need for intervention and no barriers to discharge at this time.  Bobbye Burrow, LCSW Clinical Social Worker [facility] Cell#: [phone]

## 2023-10-15 ENCOUNTER — Other Ambulatory Visit (HOSPITAL_COMMUNITY): Payer: Self-pay

## 2023-10-15 MED ORDER — POTASSIUM CHLORIDE CRYS ER 20 MEQ PO TBCR
20.0000 meq | EXTENDED_RELEASE_TABLET | Freq: Every day | ORAL | 0 refills | Status: DC
  Filled 2023-10-15: qty 5, 5d supply, fill #0

## 2023-10-15 MED ORDER — AMLODIPINE BESYLATE 5 MG PO TABS
5.0000 mg | ORAL_TABLET | Freq: Every day | ORAL | 1 refills | Status: DC
  Filled 2023-10-15: qty 30, 30d supply, fill #0

## 2023-10-15 MED ORDER — METFORMIN HCL 500 MG PO TABS
500.0000 mg | ORAL_TABLET | Freq: Two times a day (BID) | ORAL | 5 refills | Status: DC
  Filled 2023-10-15 – 2023-11-07 (×2): qty 60, 30d supply, fill #0

## 2023-10-15 MED ORDER — FUROSEMIDE 40 MG PO TABS
40.0000 mg | ORAL_TABLET | Freq: Every day | ORAL | 0 refills | Status: DC
  Filled 2023-10-15: qty 5, 5d supply, fill #0

## 2023-10-15 MED ORDER — WITCH HAZEL-GLYCERIN EX PADS: 1.0000 | MEDICATED_PAD | CUTANEOUS | Status: DC | PRN

## 2023-10-15 MED ORDER — NORETHINDRONE 0.35 MG PO TABS
1.0000 | ORAL_TABLET | Freq: Every day | ORAL | 11 refills | Status: DC
  Filled 2023-10-15: qty 28, 28d supply, fill #0

## 2023-10-15 MED ORDER — IBUPROFEN 800 MG PO TABS
800.0000 mg | ORAL_TABLET | Freq: Three times a day (TID) | ORAL | 0 refills | Status: DC
  Filled 2023-10-15: qty 30, 10d supply, fill #0

## 2023-10-15 MED ORDER — ACETAMINOPHEN 500 MG PO TABS
1000.0000 mg | ORAL_TABLET | Freq: Three times a day (TID) | ORAL | 0 refills | Status: DC
  Filled 2023-10-15: qty 30, 5d supply, fill #0

## 2023-10-15 MED ORDER — COCONUT OIL OIL: 1.0000 | TOPICAL_OIL | Status: DC | PRN

## 2023-10-15 NOTE — Plan of Care (Signed)
 Problem: Education: Goal: Knowledge of General Education information will improve Description: Including pain rating scale, medication(s)/side effects and non-pharmacologic comfort measures 10/15/2023 1427 by Osvaldo Blender, LPN Outcome: Adequate for Discharge 10/15/2023 0733 by Osvaldo Blender, LPN Outcome: Progressing   Problem: Health Behavior/Discharge Planning: Goal: Ability to manage health-related needs will improve 10/15/2023 1427 by Osvaldo Blender, LPN Outcome: Adequate for Discharge 10/15/2023 0733 by Osvaldo Blender, LPN Outcome: Progressing   Problem: Clinical Measurements: Goal: Ability to maintain clinical measurements within normal limits will improve 10/15/2023 1427 by Osvaldo Blender, LPN Outcome: Adequate for Discharge 10/15/2023 0733 by Osvaldo Blender, LPN Outcome: Progressing Goal: Will remain free from infection 10/15/2023 1427 by Osvaldo Blender, LPN Outcome: Adequate for Discharge 10/15/2023 0733 by Osvaldo Blender, LPN Outcome: Progressing Goal: Diagnostic test results will improve 10/15/2023 1427 by Osvaldo Blender, LPN Outcome: Adequate for Discharge 10/15/2023 0733 by Osvaldo Blender, LPN Outcome: Progressing Goal: Respiratory complications will improve 10/15/2023 1427 by Osvaldo Blender, LPN Outcome: Adequate for Discharge 10/15/2023 0733 by Osvaldo Blender, LPN Outcome: Progressing Goal: Cardiovascular complication will be avoided 10/15/2023 1427 by Osvaldo Blender, LPN Outcome: Adequate for Discharge 10/15/2023 0733 by Osvaldo Blender, LPN Outcome: Progressing   Problem: Activity: Goal: Risk for activity intolerance will decrease 10/15/2023 1427 by Osvaldo Blender, LPN Outcome: Adequate for Discharge 10/15/2023 0733 by Osvaldo Blender, LPN Outcome: Progressing   Problem: Nutrition: Goal: Adequate nutrition will be maintained 10/15/2023 1427 by Osvaldo Blender, LPN Outcome: Adequate for Discharge 10/15/2023 0733 by Osvaldo Blender, LPN Outcome: Progressing    Problem: Coping: Goal: Level of anxiety will decrease 10/15/2023 1427 by Osvaldo Blender, LPN Outcome: Adequate for Discharge 10/15/2023 0733 by Osvaldo Blender, LPN Outcome: Progressing   Problem: Elimination: Goal: Will not experience complications related to bowel motility 10/15/2023 1427 by Osvaldo Blender, LPN Outcome: Adequate for Discharge 10/15/2023 0733 by Osvaldo Blender, LPN Outcome: Progressing Goal: Will not experience complications related to urinary retention 10/15/2023 1427 by Osvaldo Blender, LPN Outcome: Adequate for Discharge 10/15/2023 0733 by Osvaldo Blender, LPN Outcome: Progressing   Problem: Pain Managment: Goal: General experience of comfort will improve and/or be controlled 10/15/2023 1427 by Osvaldo Blender, LPN Outcome: Adequate for Discharge 10/15/2023 0733 by Osvaldo Blender, LPN Outcome: Progressing   Problem: Safety: Goal: Ability to remain free from injury will improve 10/15/2023 1427 by Osvaldo Blender, LPN Outcome: Adequate for Discharge 10/15/2023 0733 by Osvaldo Blender, LPN Outcome: Progressing   Problem: Skin Integrity: Goal: Risk for impaired skin integrity will decrease 10/15/2023 1427 by Osvaldo Blender, LPN Outcome: Adequate for Discharge 10/15/2023 0733 by Osvaldo Blender, LPN Outcome: Progressing   Problem: Education: Goal: Knowledge of Childbirth will improve 10/15/2023 1427 by Osvaldo Blender, LPN Outcome: Adequate for Discharge 10/15/2023 0733 by Osvaldo Blender, LPN Outcome: Progressing Goal: Ability to make informed decisions regarding treatment and plan of care will improve 10/15/2023 1427 by Osvaldo Blender, LPN Outcome: Adequate for Discharge 10/15/2023 0733 by Osvaldo Blender, LPN Outcome: Progressing Goal: Ability to state and carry out methods to decrease the pain will improve 10/15/2023 1427 by Osvaldo Blender, LPN Outcome: Adequate for Discharge 10/15/2023 0733 by Osvaldo Blender, LPN Outcome: Progressing Goal: Individualized Educational  Video(s) 10/15/2023 1427 by Osvaldo Blender, LPN Outcome: Adequate for Discharge 10/15/2023 0733 by Osvaldo Blender, LPN Outcome: Progressing   Problem: Coping: Goal: Ability to verbalize  concerns and feelings about labor and delivery will improve 10/15/2023 1427 by Osvaldo Blender, LPN Outcome: Adequate for Discharge 10/15/2023 0733 by Osvaldo Blender, LPN Outcome: Progressing   Problem: Life Cycle: Goal: Ability to make normal progression through stages of labor will improve 10/15/2023 1427 by Osvaldo Blender, LPN Outcome: Adequate for Discharge 10/15/2023 0733 by Osvaldo Blender, LPN Outcome: Progressing Goal: Ability to effectively push during vaginal delivery will improve 10/15/2023 1427 by Osvaldo Blender, LPN Outcome: Adequate for Discharge 10/15/2023 0733 by Osvaldo Blender, LPN Outcome: Progressing   Problem: Role Relationship: Goal: Will demonstrate positive interactions with the child 10/15/2023 1427 by Osvaldo Blender, LPN Outcome: Adequate for Discharge 10/15/2023 0733 by Osvaldo Blender, LPN Outcome: Progressing   Problem: Safety: Goal: Risk of complications during labor and delivery will decrease 10/15/2023 1427 by Osvaldo Blender, LPN Outcome: Adequate for Discharge 10/15/2023 0733 by Osvaldo Blender, LPN Outcome: Progressing   Problem: Pain Management: Goal: Relief or control of pain from uterine contractions will improve 10/15/2023 1427 by Osvaldo Blender, LPN Outcome: Adequate for Discharge 10/15/2023 0733 by Osvaldo Blender, LPN Outcome: Progressing   Problem: Education: Goal: Knowledge of condition will improve 10/15/2023 1427 by Osvaldo Blender, LPN Outcome: Adequate for Discharge 10/15/2023 0733 by Osvaldo Blender, LPN Outcome: Progressing Goal: Individualized Educational Video(s) 10/15/2023 1427 by Osvaldo Blender, LPN Outcome: Adequate for Discharge 10/15/2023 0733 by Osvaldo Blender, LPN Outcome: Progressing Goal: Individualized Newborn Educational Video(s) 10/15/2023  1427 by Osvaldo Blender, LPN Outcome: Adequate for Discharge 10/15/2023 0733 by Osvaldo Blender, LPN Outcome: Progressing   Problem: Activity: Goal: Will verbalize the importance of balancing activity with adequate rest periods 10/15/2023 1427 by Osvaldo Blender, LPN Outcome: Adequate for Discharge 10/15/2023 0733 by Osvaldo Blender, LPN Outcome: Progressing Goal: Ability to tolerate increased activity will improve 10/15/2023 1427 by Osvaldo Blender, LPN Outcome: Adequate for Discharge 10/15/2023 0733 by Osvaldo Blender, LPN Outcome: Progressing   Problem: Coping: Goal: Ability to identify and utilize available resources and services will improve 10/15/2023 1427 by Osvaldo Blender, LPN Outcome: Adequate for Discharge 10/15/2023 0733 by Osvaldo Blender, LPN Outcome: Progressing   Problem: Life Cycle: Goal: Chance of risk for complications during the postpartum period will decrease 10/15/2023 1427 by Osvaldo Blender, LPN Outcome: Adequate for Discharge 10/15/2023 0733 by Osvaldo Blender, LPN Outcome: Progressing   Problem: Role Relationship: Goal: Ability to demonstrate positive interaction with newborn will improve 10/15/2023 1427 by Osvaldo Blender, LPN Outcome: Adequate for Discharge 10/15/2023 0733 by Osvaldo Blender, LPN Outcome: Progressing   Problem: Skin Integrity: Goal: Demonstration of wound healing without infection will improve 10/15/2023 1427 by Osvaldo Blender, LPN Outcome: Adequate for Discharge 10/15/2023 0733 by Osvaldo Blender, LPN Outcome: Progressing

## 2023-10-15 NOTE — Plan of Care (Signed)

## 2023-10-15 NOTE — Lactation Note (Signed)
 This note was copied from a baby's chart. Lactation Consultation Note  Patient Name: Sonya Collins [id number]'M Date: 10/15/2023 Age:21 hours Reason for consult: Follow-up assessment;Maternal discharge;Exclusive pumping and bottle feeding;Early term 37-38.6wks  P1, 38 wks, @ 48 hrs of life. Discharge anticipated today. Mom feeding formula and using breast pump by choice- Encouraged coconut oil use with pumping- importance of nipple care. Discussed cluster feeding overnight/ early morning brings in our milk supply, shared expectations of milk coming in. Highlighted risk of engorgement. Encouraged movement of milk every 2-3 hours, supply based on demand. Discussed hand pump/express to soften breasts, motrin  as anti-inflammatory, and ice packs for 10-20 minutes post feed/pumping if still over-full is the best treatments for inflamed/engorged breasts.    Feeding Mother's Current Feeding Choice: Breast Milk and Formula Nipple Type: Nfant Standard Flow (white)  Lactation Tools Discussed/Used Breast pump type: Manual;Double-Electric Breast Pump Pump Education: Milk Storage Reason for Pumping: Moms choice- exclusive pump and feed Pumping frequency: Every 3 hrs  Interventions Interventions: Hand pump;DEBP;Education;LC Services brochure;CDC milk storage guidelines  Discharge Discharge Education: Engorgement and breast care Pump: DEBP;Manual;Personal (Demonstrated pump kit parts create a hand pump, highlighted best when overly engorged for softening breasts)  Consult Status Consult Status: Complete Date: 10/15/23 Follow-up type: In-patient    [facility] 10/15/2023, 9:07 AM

## 2023-10-15 NOTE — Discharge Instructions (Addendum)

## 2023-10-16 ENCOUNTER — Inpatient Hospital Stay (HOSPITAL_COMMUNITY): Admission: RE | Admit: 2023-10-16 | Source: Home / Self Care | Admitting: Family Medicine

## 2023-10-16 ENCOUNTER — Inpatient Hospital Stay (HOSPITAL_COMMUNITY)

## 2023-10-20 ENCOUNTER — Ambulatory Visit

## 2023-10-23 ENCOUNTER — Inpatient Hospital Stay (HOSPITAL_COMMUNITY): Admit: 2023-10-23

## 2023-10-26 ENCOUNTER — Telehealth (HOSPITAL_COMMUNITY): Payer: Self-pay | Admitting: *Deleted

## 2023-10-26 NOTE — Telephone Encounter (Signed)
 10/26/2023  Name: Altamese Throop MRN: [id number] DOB: 19-Aug-2000  Reason for Call:  Transition of Care Hospital Discharge Call  Contact Status: Patient Contact Status: Complete  Language assistant needed: Interpreter Mode: Interpreter Not Needed        Follow-Up Questions: Do You Have Any Concerns About Your Health As You Heal From Delivery?: No Do You Have Any Concerns About Your Infants Health?: No  Edinburgh Postnatal Depression Scale:  In the Past 7 Days:    PHQ2-9 Depression Scale:     Discharge Follow-up: Edinburgh score requires follow up?:  (patient declined screening today, endorses she is doing well emotionally) Patient was advised of the following resources:: Support Group, Breastfeeding Support Group  Post-discharge interventions: Reviewed Newborn Safe Sleep Practices  Pearlie Bougie, RN 10/26/2023 10:53

## 2023-11-07 ENCOUNTER — Other Ambulatory Visit: Payer: Self-pay | Admitting: Obstetrics and Gynecology

## 2023-11-08 ENCOUNTER — Other Ambulatory Visit: Payer: Self-pay

## 2023-11-08 ENCOUNTER — Other Ambulatory Visit (HOSPITAL_COMMUNITY): Payer: Self-pay

## 2023-11-09 ENCOUNTER — Other Ambulatory Visit (HOSPITAL_COMMUNITY): Payer: Self-pay

## 2023-11-10 ENCOUNTER — Other Ambulatory Visit (HOSPITAL_COMMUNITY): Payer: Self-pay

## 2023-11-24 ENCOUNTER — Ambulatory Visit: Admitting: Obstetrics and Gynecology

## 2023-11-24 DIAGNOSIS — O139 Gestational [pregnancy-induced] hypertension without significant proteinuria, unspecified trimester: Secondary | ICD-10-CM

## 2023-11-24 MED ORDER — NIFEDIPINE ER OSMOTIC RELEASE 30 MG PO TB24
30.0000 mg | ORAL_TABLET | Freq: Every day | ORAL | 2 refills | Status: DC
Start: 2023-11-24 — End: 2024-01-19

## 2023-11-24 MED ORDER — FUROSEMIDE 40 MG PO TABS: 40.0000 mg | ORAL_TABLET | Freq: Every day | ORAL | 0 refills | Status: DC

## 2023-11-24 MED ORDER — SLYND 4 MG PO TABS: 1.0000 | ORAL_TABLET | Freq: Every day | ORAL | 4 refills | Status: DC

## 2023-11-24 NOTE — Progress Notes (Signed)
 Post Partum Visit Note  Sonya Collins is a 23 y.o. G8P1001 female who presents for a postpartum visit. She is 6 weeks postpartum following a normal spontaneous vaginal delivery.  I have fully reviewed the prenatal and intrapartum course. The delivery was at 38 gestational weeks.  Anesthesia: epidural. Postpartum course has been good. Baby is doing well yes. Baby is feeding by bottle - Similac Advance. Bleeding red. Bowel function is normal. Bladder function is normal. Patient is sexually active. Contraception method is oral progesterone-only contraceptive. Postpartum depression screening: negative.   The pregnancy intention screening data noted above was reviewed. Potential methods of contraception were discussed. The patient elected to proceed with No data recorded.   Edinburgh Postnatal Depression Scale - 11/24/23 1445       Edinburgh Postnatal Depression Scale:  In the Past 7 Days   I have been able to laugh and see the funny side of things. 0    I have looked forward with enjoyment to things. 0    I have blamed myself unnecessarily when things went wrong. 1    I have been anxious or worried for no good reason. 0    I have felt scared or panicky for no good reason. 0    Things have been getting on top of me. 0    I have been so unhappy that I have had difficulty sleeping. 0    I have felt sad or miserable. 0    I have been so unhappy that I have been crying. 1    The thought of harming myself has occurred to me. 0    Edinburgh Postnatal Depression Scale Total 2             Health Maintenance Due  Topic Date Due   Meningococcal B Vaccine (1 of 2 - Standard) Never done   Cervical Cancer Screening (Pap smear)  Never done    The following portions of the patient's history were reviewed and updated as appropriate: allergies, current medications, past family history, past medical history, past social history, past surgical history, and problem list.  Review of  Systems Pertinent items are noted in HPI.  Objective:  BP (!) 141/97   Pulse 85   Ht 5\' 4"  (1.626 m)   Wt (!) 355 lb (161 kg)   LMP 01/10/2023   Breastfeeding No   BMI 60.94 kg/m    General:  alert and cooperative   Breasts:  normal  Lungs: Normal effort        Wound N/a  GU exam:  not indicated       Assessment:   1. Postpartum exam (Primary) Trial switch to slynd  Contraindicated to coc given HTN, does not desire nexplanon , depo,iud   Would like to come back for annual with pap  - Drospirenone (SLYND) 4 MG TABS; Take 1 tablet (4 mg total) by mouth daily.  Dispense: 84 tablet; Refill: 4  2. Gestational hypertension, antepartum Switch to nifedipine , given significant lower extremity edema, repeat lasix   - NIFEdipine  (PROCARDIA -XL/NIFEDICAL-XL) 30 MG 24 hr tablet; Take 1 tablet (30 mg total) by mouth daily.  Dispense: 30 tablet; Refill: 2 - furosemide  (LASIX ) 40 MG tablet; Take 1 tablet (40 mg total) by mouth daily.  Dispense: 5 tablet; Refill: 0    Plan:   Essential components of care per ACOG recommendations:  1.  Mood and well being: Patient with negative depression screening today. Reviewed local resources for support.    2. Infant  care and feeding:  -Patient currently breastmilk feeding? No.  -Social determinants of health (SDOH) reviewed in EPIC. No concerns  3. Sexuality, contraception and birth spacing - Patient does not want a pregnancy in the next year.  Desired family size is  children.  - Reviewed reproductive life planning. Reviewed contraceptive methods based on pt preferences and effectiveness.  Patient desired Oral Contraceptive today.   - Discussed birth spacing of 18 months  4. Sleep and fatigue -Encouraged family/partner/community support of 4 hrs of uninterrupted sleep to help with mood and fatigue  5. Physical Recovery  - Discussed patients delivery and complications. She describes her labor as good. - Patient had a Vaginal, no problems at  delivery. Patient had a 1st degree laceration. Perineal healing reviewed. Patient expressed understanding - Patient has urinary incontinence? No. - Patient is safe to resume physical and sexual activity  6.  Health Maintenance - HM due items addressed Yes - Last pap smear No results found for: "DIAGPAP" Pap smear done at today's visit.  -Breast Cancer screening indicated? No.   7. Chronic Disease/Pregnancy Condition follow up: Hypertension  - PCP follow up Return in one week for BP check and 2 months for annual  [employer], FNP [facility] [employer]

## 2023-11-25 ENCOUNTER — Other Ambulatory Visit: Payer: Self-pay

## 2023-11-25 MED ORDER — SLYND 4 MG PO TABS: 1.0000 | ORAL_TABLET | Freq: Every day | ORAL | 4 refills | Status: DC

## 2023-11-25 NOTE — Progress Notes (Signed)
 Slynd  [facility]

## 2023-12-01 ENCOUNTER — Ambulatory Visit

## 2023-12-01 DIAGNOSIS — Z0130 Encounter for examination of blood pressure without abnormal findings: Secondary | ICD-10-CM

## 2023-12-01 NOTE — Progress Notes (Signed)
 Subjective:  Sonya Collins is a 23 y.o. female here for BP check.   Hypertension ROS: taking medications as instructed, no medication side effects noted, no TIA's, no chest pain on exertion, no dyspnea on exertion, and no swelling of ankles.    Objective:  BP 125/84   Pulse 89   Breastfeeding No   Appearance alert, well appearing, and in no distress. General exam BP noted to be well controlled today in office.    Assessment:   Blood Pressure well controlled.   Plan:  Current treatment plan is effective, no change in therapy.  Pt would like to stop nifedipine  as it is causing headaches. Pt seen Susi Eric, FNP for [facility] visit. Spoke with [facility] in office who states we can switch to Amlodipine  or trial pt off of medication for two days with f/u BP check. Pt states she did not take Nifedipine  today, so pt to not take dose tomorrow and f/u 6/6 for BP check.

## 2023-12-03 ENCOUNTER — Ambulatory Visit

## 2023-12-03 DIAGNOSIS — Z0130 Encounter for examination of blood pressure without abnormal findings: Secondary | ICD-10-CM

## 2023-12-03 NOTE — Progress Notes (Signed)
 Subjective:  Sonya Collins is a 23 y.o. female here for BP check.   Hypertension ROS: no TIA's, no chest pain on exertion, no dyspnea on exertion, and no swelling of ankles.    Objective:  BP 128/85   Appearance alert, well appearing, and in no distress. General exam BP noted to be well controlled today in office.    Assessment:   Blood Pressure well controlled.   Plan:  Orders and follow up as documented in patient record. Per previous consult with Susi Eric, FNP, BP able to discontinue BP medications since BP is within normal limits.

## 2024-01-19 ENCOUNTER — Ambulatory Visit: Admitting: Obstetrics and Gynecology

## 2024-01-19 ENCOUNTER — Other Ambulatory Visit (HOSPITAL_COMMUNITY)
Admission: RE | Admit: 2024-01-19 | Discharge: 2024-01-19 | Disposition: A | Discharge status: Home / Self Care | Source: Ambulatory Visit | Attending: Obstetrics and Gynecology | Admitting: Obstetrics and Gynecology

## 2024-01-19 ENCOUNTER — Encounter: Payer: Self-pay | Admitting: Obstetrics and Gynecology

## 2024-01-19 VITALS — BP 132/84 | HR 99 | Ht 64.0 in | Wt 358.0 lb

## 2024-01-19 DIAGNOSIS — Z124 Encounter for screening for malignant neoplasm of cervix: Secondary | ICD-10-CM

## 2024-01-19 DIAGNOSIS — Z01419 Encounter for gynecological examination (general) (routine) without abnormal findings: Secondary | ICD-10-CM

## 2024-01-19 DIAGNOSIS — R7309 Other abnormal glucose: Secondary | ICD-10-CM

## 2024-01-19 DIAGNOSIS — Z113 Encounter for screening for infections with a predominantly sexual mode of transmission: Secondary | ICD-10-CM | POA: Insufficient documentation

## 2024-01-19 DIAGNOSIS — Z1331 Encounter for screening for depression: Secondary | ICD-10-CM

## 2024-01-19 DIAGNOSIS — N898 Other specified noninflammatory disorders of vagina: Secondary | ICD-10-CM | POA: Diagnosis not present

## 2024-01-19 DIAGNOSIS — R030 Elevated blood-pressure reading, without diagnosis of hypertension: Secondary | ICD-10-CM

## 2024-01-19 LAB — POCT URINE PREGNANCY: Preg Test, Ur: NEGATIVE

## 2024-01-19 NOTE — Patient Instructions (Addendum)

## 2024-01-19 NOTE — Progress Notes (Signed)
 ANNUAL GYNECOLOGY VISIT Chief Complaint  Patient presents with   Gynecologic Exam     Subjective:  Sonya Collins is a 23 y.o. G1P1001 who presents for annual exam.  Reports vaginal discharge with odor.   Gyn History: Patient's last menstrual period was 12/29/2023 (exact date). Sexually active: yes/no: Yes Contraception: condoms Last pap: No results found for: DIAGPAP, HPV, HPVHIGH History of abnormal pap: n/a Periods: regular     Upstream - 01/19/24 1540       Pregnancy Intention Screening   Does the patient want to become pregnant in the next year? No    Does the patient's partner want to become pregnant in the next year? No    Would the patient like to discuss contraceptive options today? No      Contraception Wrap Up   Current Method Female Condom         The pregnancy intention screening data noted above was reviewed. Potential methods of contraception were discussed. The patient elected to proceed with No data recorded.       01/19/2024    3:41 PM 05/06/2023    9:17 AM 04/01/2023    9:43 AM 07/06/2022   11:01 AM 05/08/2022   11:07 AM  Depression screen PHQ 2/9  Decreased Interest 0 1 0 1 0  Down, Depressed, Hopeless 0 1 0 1 0  PHQ - 2 Score 0 2 0 2 0  Altered sleeping 0 0 0 3 0  Tired, decreased energy 0 1 1 2  0  Change in appetite 2 0 0 3 0  Feeling bad or failure about yourself  0 0 0 1 0  Trouble concentrating 0 0 0 1 0  Moving slowly or fidgety/restless 0 0 0 2 0  Suicidal thoughts 0 0 0 0 0  PHQ-9 Score 2 3 1 14  0  Difficult doing work/chores Not difficult at all Not difficult at all  Somewhat difficult         01/19/2024    3:41 PM 05/06/2023    9:26 AM 05/08/2022   11:07 AM 10/28/2021   12:06 PM  GAD 7 : Generalized Anxiety Score  Nervous, Anxious, on Edge 0 0 0 3  Control/stop worrying 0 3 0 3  Worry too much - different things 0 3 0 3  Trouble relaxing 0 1 0 2  Restless 0 0 0 3  Easily annoyed or irritable 0 3 0 2  Afraid - awful  might happen 0 1 0 3  Total GAD 7 Score 0 11 0 19  Anxiety Difficulty Not difficult at all Not difficult at all  Very difficult      OB History     Gravida  1   Para  1   Term  1   Preterm  0   AB  0   Living  1      SAB  0   IAB  0   Ectopic  0   Multiple  0   Live Births  1           Past Medical History:  Diagnosis Date   Candidiasis, intertrigo 01/08/2022   Depression    Intolerance to cold 10/28/2021   Nocturnal enuresis 10/28/2021   Obesity    Tremor 10/28/2021    Past Surgical History:  Procedure Laterality Date   MYRINGOTOMY      Social History   Socioeconomic History   Marital status: Single    Spouse name: Not on  file   Number of children: Not on file   Years of education: Not on file   Highest education level: Not on file  Occupational History   Not on file  Tobacco Use   Smoking status: Former    Types: E-cigarettes    Quit date: 02/12/2023    Years since quitting: 0.9   Smokeless tobacco: Current  Vaping Use   Vaping status: Former  Substance and Sexual Activity   Alcohol use: Not Currently    Comment: on weekends   Drug use: Not Currently    Types: Marijuana    Comment: occ   Sexual activity: Yes    Partners: Male    Birth control/protection: None, Condom  Other Topics Concern   Not on file  Social History Narrative   Lives with her grandparents, works at [employer] office.    Social Drivers of Corporate investment banker Strain: Not on file  Food Insecurity: No Food Insecurity (10/11/2023)   Hunger Vital Sign    Worried About Running Out of Food in the Last Year: Never true    Ran Out of Food in the Last Year: Never true  Transportation Needs: No Transportation Needs (10/11/2023)   PRAPARE - Administrator, Civil Service (Medical): No    Lack of Transportation (Non-Medical): No  Physical Activity: Not on file  Stress: Not on file  Social Connections: Not on file    Family History  Problem  Relation Age of Onset   Hypertension Father    Diabetes Father    Schizophrenia Brother    Breast cancer Other    Breast cancer Other     Current Outpatient Medications on File Prior to Visit  Medication Sig Dispense Refill   acetaminophen  (TYLENOL ) 500 MG tablet Take 2 tablets (1,000 mg total) by mouth every 8 (eight) hours. (Patient not taking: Reported on 12/01/2023) 30 tablet 0   coconut oil OIL Apply 1 Application topically as needed. (Patient not taking: Reported on 12/01/2023)     cyclobenzaprine  (FLEXERIL ) 5 MG tablet Take 1 tablet (5 mg total) by mouth 3 (three) times daily as needed for muscle spasms. MAY CAUSE DROWSINESS, DO NOT OPERATE HEAVY MACHINERY AFTER TAKING THIS MEDICATION (Patient not taking: Reported on 12/01/2023) 20 tablet 0   Drospirenone  (SLYND ) 4 MG TABS Take 1 tablet (4 mg total) by mouth daily. (Patient not taking: Reported on 01/19/2024) 84 tablet 4   furosemide  (LASIX ) 40 MG tablet Take 1 tablet (40 mg total) by mouth daily. (Patient not taking: Reported on 01/19/2024) 5 tablet 0   ibuprofen  (ADVIL ) 800 MG tablet Take 1 tablet (800 mg total) by mouth every 8 (eight) hours. (Patient not taking: Reported on 11/24/2023) 30 tablet 0   metFORMIN  (GLUCOPHAGE ) 500 MG tablet Take 1 tablet (500 mg total) by mouth 2 (two) times daily with a meal. (Patient not taking: Reported on 11/24/2023) 60 tablet 5   NIFEdipine  (PROCARDIA -XL/NIFEDICAL-XL) 30 MG 24 hr tablet Take 1 tablet (30 mg total) by mouth daily. (Patient not taking: Reported on 12/03/2023) 30 tablet 2   norethindrone  (MICRONOR ) 0.35 MG tablet Take 1 tablet (0.35 mg total) by mouth daily. (Patient not taking: Reported on 11/24/2023) 28 tablet 11   norethindrone  (ORTHO MICRONOR ) 0.35 MG tablet Take 1 tablet (0.35 mg total) by mouth daily. (Patient not taking: Reported on 01/19/2024) 28 tablet 11   potassium chloride  SA (KLOR-CON  M) 20 MEQ tablet Take 1 tablet (20 mEq total) by mouth daily for  5 days. 5 tablet 0   Prenatal Vit-Fe  Fumarate-FA (PRENATAL PLUS VITAMIN/MINERAL) 27-1 MG TABS Take 1 tablet by mouth daily. (Patient not taking: Reported on 11/24/2023) 30 tablet 12   witch hazel-glycerin  (TUCKS) pad Apply 1 Application topically as needed for hemorrhoids. (Patient not taking: Reported on 12/01/2023)     No current facility-administered medications on file prior to visit.    Allergies  Allergen Reactions   Kiwi Extract      Objective:   Vitals:   01/19/24 1534 01/19/24 1542 01/19/24 1547  BP: (!) 152/81 (!) 149/85 132/84  Pulse: (!) 101 (!) 101 99  Weight: (!) 358 lb (162.4 kg)    Height: 5' 4 (1.626 m)     Physical Examination:   General appearance - well appearing, and in no distress  Mental status - alert, oriented to person, place, and time  Psych:  normal mood and affect  Breasts - pt declines exam  Abdomen - soft, nontender, nondistended, no masses or organomegaly  Pelvic -  VULVA: normal appearing vulva with no masses, tenderness or lesions   VAGINA: normal appearing vagina with normal color and discharge, no lesions   CERVIX: normal appearing cervix without discharge or lesions, no CMT  Thin prep pap is done with reflex HR HPV cotesting  UTERUS: uterus is felt to be normal size, shape, consistency and nontender   ADNEXA: No adnexal masses or tenderness noted.  Extremities:  No swelling or varicosities noted  Chaperone present for exam  Assessment and Plan:  1. Well woman exam with routine gynecological exam (Primary) Pap Patient declines clinical breast exam, reviewed self breast exams, mammograms to start at age 54 STI screening Condoms for contraception, declines other options  2. Cervical cancer screening - Cytology - PAP( [place])  3. Routine screening for STI (sexually transmitted infection) - Cervicovaginal ancillary only( [place]) - HIV Antibody (routine testing w rflx) - Hepatitis B surface antigen - Hepatitis C Antibody - RPR  4. Elevated hemoglobin  A1c Prediabetes range during pregnancy - Hemoglobin A1c  5. Vaginal discharge - Cervicovaginal ancillary only( [place])  6. Elevated blood pressure reading Hx gestational hypertension, BP initially elevated then normal on repeat, recommend PCP f/u   No follow-ups on file.  No future appointments.  Rollo ONEIDA Bring, MD, FACOG Obstetrician & Gynecologist, [facility] for [facility], [facility]

## 2024-01-19 NOTE — Progress Notes (Signed)
 23 y.o. GYN presents for PAP/STD screening.  Pt had PP visit 11/24/2023.  C/o vaginal odor.

## 2024-01-20 ENCOUNTER — Ambulatory Visit: Payer: Self-pay | Admitting: Obstetrics and Gynecology

## 2024-01-20 DIAGNOSIS — B9689 Other specified bacterial agents as the cause of diseases classified elsewhere: Secondary | ICD-10-CM

## 2024-01-20 LAB — CERVICOVAGINAL ANCILLARY ONLY
Bacterial Vaginitis (gardnerella): POSITIVE — AB
Candida Glabrata: NEGATIVE
Candida Vaginitis: NEGATIVE
Chlamydia: NEGATIVE
Comment: NEGATIVE
Comment: NEGATIVE
Comment: NEGATIVE
Comment: NEGATIVE
Comment: NEGATIVE
Comment: NORMAL
Neisseria Gonorrhea: NEGATIVE
Trichomonas: NEGATIVE

## 2024-01-20 LAB — HEPATITIS C ANTIBODY: Hep C Virus Ab: NONREACTIVE

## 2024-01-20 LAB — HEMOGLOBIN A1C
Est. average glucose Bld gHb Est-mCnc: 111 mg/dL
Hgb A1c MFr Bld: 5.5 % (ref 4.8–5.6)

## 2024-01-20 LAB — HEPATITIS B SURFACE ANTIGEN: Hepatitis B Surface Ag: NEGATIVE

## 2024-01-20 LAB — HIV ANTIBODY (ROUTINE TESTING W REFLEX): HIV Screen 4th Generation wRfx: NONREACTIVE

## 2024-01-20 LAB — RPR: RPR Ser Ql: NONREACTIVE

## 2024-01-20 MED ORDER — METRONIDAZOLE 0.75 % VA GEL: 1.0000 | Freq: Every day | VAGINAL | 1 refills | Status: DC

## 2024-01-27 LAB — CYTOLOGY - PAP
Comment: NEGATIVE
Diagnosis: UNDETERMINED — AB
High risk HPV: POSITIVE — AB

## 2024-01-28 MED ORDER — FLUCONAZOLE 150 MG PO TABS: 150.0000 mg | ORAL_TABLET | Freq: Once | ORAL | 0 refills | Status: AC

## 2024-02-22 ENCOUNTER — Ambulatory Visit (INDEPENDENT_AMBULATORY_CARE_PROVIDER_SITE_OTHER): Admitting: Obstetrics and Gynecology

## 2024-02-22 VITALS — BP 137/83 | HR 64

## 2024-02-22 DIAGNOSIS — R87619 Unspecified abnormal cytological findings in specimens from cervix uteri: Secondary | ICD-10-CM

## 2024-02-22 DIAGNOSIS — Z3201 Encounter for pregnancy test, result positive: Secondary | ICD-10-CM | POA: Diagnosis not present

## 2024-02-22 DIAGNOSIS — O282 Abnormal cytological finding on antenatal screening of mother: Secondary | ICD-10-CM | POA: Insufficient documentation

## 2024-02-22 LAB — POCT URINE PREGNANCY: Preg Test, Ur: POSITIVE — AB

## 2024-02-22 NOTE — Progress Notes (Signed)
   ESTABLISHED GYNECOLOGY VISIT Chief Complaint  Patient presents with   Follow-up    Subjective:  Sonya Collins is a 23 y.o. G1P1001 presenting for colposcopy for ASCUS pap.  Pregnancy test is positive. This is an unplanned pregnancy. She did not know she was pregnant until now. LMP 7/2 so is approximately [redacted]w[redacted]d She denies vaginal bleeding or pelvic pain.    Review of Systems:   Pertinent items are noted in HPI  Pertinent History Reviewed:  Reviewed past medical,surgical, social and family history.  Reviewed problem list, medications and allergies.  Objective:   Vitals:   02/22/24 1355  BP: 137/83  Pulse: 64   Physical Examination:   General appearance - well appearing, and in no distress, tearful  Assessment and Plan:  1. Positive pregnancy test (Primary) Patient surprised and tearful. Support given. She will let us  know if she would like to schedule any follow up appointment and declines at this time - POCT urine pregnancy  2. ASCUS (atypical squamous cells of undetermined significance) on gynecologic Papanicolaou smear complicating pregnancy, antepartum Do not recommend colposcopy today Based on her age, she can repeat her pap in 1 year   Rollo ONEIDA Bring, MD, FACOG Obstetrician & Gynecologist, [employer] for [employer], [facility]

## 2024-02-22 NOTE — Progress Notes (Signed)
 Pt is in office for Colposcopy.  UPT in office today is Positive.   Pt states LMP 12/29/23 - has not taken home preg test.  Pt is unsure if she will keep this pregnancy, is worried about need for colpo.   Made aware provider will talk with pt today to discuss pap/colpo and positive pregnancy.

## 2024-02-23 ENCOUNTER — Encounter: Payer: Self-pay | Admitting: Obstetrics and Gynecology

## 2024-02-29 ENCOUNTER — Ambulatory Visit (INDEPENDENT_AMBULATORY_CARE_PROVIDER_SITE_OTHER): Admitting: *Deleted

## 2024-02-29 ENCOUNTER — Other Ambulatory Visit (INDEPENDENT_AMBULATORY_CARE_PROVIDER_SITE_OTHER): Payer: Self-pay

## 2024-02-29 VITALS — BP 123/83 | HR 73 | Wt 364.9 lb

## 2024-02-29 DIAGNOSIS — Z3A08 8 weeks gestation of pregnancy: Secondary | ICD-10-CM

## 2024-02-29 DIAGNOSIS — Z1332 Encounter for screening for maternal depression: Secondary | ICD-10-CM | POA: Diagnosis not present

## 2024-02-29 DIAGNOSIS — O0990 Supervision of high risk pregnancy, unspecified, unspecified trimester: Secondary | ICD-10-CM | POA: Diagnosis not present

## 2024-02-29 DIAGNOSIS — O3680X0 Pregnancy with inconclusive fetal viability, not applicable or unspecified: Secondary | ICD-10-CM

## 2024-02-29 DIAGNOSIS — O0991 Supervision of high risk pregnancy, unspecified, first trimester: Secondary | ICD-10-CM

## 2024-02-29 DIAGNOSIS — O219 Vomiting of pregnancy, unspecified: Secondary | ICD-10-CM

## 2024-02-29 MED ORDER — DOXYLAMINE-PYRIDOXINE 10-10 MG PO TBEC: 2.0000 | DELAYED_RELEASE_TABLET | Freq: Every day | ORAL | 5 refills | Status: AC

## 2024-02-29 MED ORDER — PROMETHAZINE HCL 25 MG PO TABS: 25.0000 mg | ORAL_TABLET | Freq: Four times a day (QID) | ORAL | 1 refills | Status: AC | PRN

## 2024-02-29 MED ORDER — PREPLUS 27-1 MG PO TABS: 1.0000 | ORAL_TABLET | Freq: Every day | ORAL | 13 refills | Status: DC

## 2024-02-29 NOTE — Patient Instructions (Signed)
 [facility] [employer] has a partnership with the [employer] to provide prenatal navigation for the most needed resources in our community. In order to see how we can help connect you to these resources we need consent to contact you. Please complete the very short consent using the link below:   English Link: https://[email or web address].tfaforms.[email or web address]?site=16  Spanish Link: https://[place].tfaforms.net/287?site=16  Our practice his participating in a study that provides no-cost doula care. ACURE4Moms is a study looking at how doula care can reduce birthing disparities for Black and brown birthing people. We like to refer patients as soon as possible, but definitely before 28 weeks so patients can get to know their doula.    A doula is trained to provide support before, during and just after you give birth. While doulas do not provide medical care, they do provide emotional, physical and educational support. Doulas can help reduce your stress and comfort you and your partner. They can help you cope with labor by helping you use breathing techniques, massage, creative labor positioning, essential oils and affirmations.   ACURE4Moms is a research study trying to reduce:   low birthweight babies  emergency department visits & hospitalizations for birthing persons and their babies  depression among birthing people  discrimination in pregnancy-related care ACURE4Moms is trying out 2 programs designed by  people who have given birth. These programs include: 1. Sharing patient data and warning alerts with clinic staff to keep them accountable for their patients' outcomes and providing tools to help them  reduce bias in care. 2. Matching eligible patients with doulas from the  same community as the patients.  If you would like to participate in this study, please visit:   http://carroll-castaneda.info/  Options for Doula Care in the [place] Area  As you review  your birthing options, consider having a birth doula. A doula is trained to provide support before, during and just after you give birth. There are also postpartum doulas that help you adjust to new parenthood.  While doulas do not provide medical care, they do provide emotional, physical and educational support. A few months before your baby arrives, doulas can help answer questions, ease concerns and help you create and support your birthing plan.    Doulas can help reduce your stress and comfort you and your partner. They can help you cope with labor by helping you use breathing techniques, massage, creative labor positioning, essential oils and affirmations.   Studies show that the benefits of having a doula include:   A more positive birth experience  Fewer requests for pain-relief medication  Less likelihood of cesarean section, commonly called a c-section   Doulas are typically hired via a Advertising account planner between you and the doula. We are happy to provide a list of the most active doulas in the area, all of whom are credentialed by [facility] and will not count as a visitor at your birth.  There are several options for no-cost doula care at our hospital, including:  [facility] Volunteer Doula Program Every [employer] Program A Cure 4 Moms Doula Study (available only at [employer] for Women, [place], [place] and [facility] offices)  For more information on these programs or to receive a list of doulas active in our area, please email [email or web address]
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
## 2024-03-02 ENCOUNTER — Ambulatory Visit: Payer: Self-pay | Admitting: Obstetrics and Gynecology

## 2024-03-02 LAB — URINE CULTURE, OB REFLEX

## 2024-03-02 LAB — CULTURE, OB URINE

## 2024-03-16 ENCOUNTER — Encounter: Payer: Self-pay | Admitting: Obstetrics and Gynecology

## 2024-03-16 ENCOUNTER — Ambulatory Visit (INDEPENDENT_AMBULATORY_CARE_PROVIDER_SITE_OTHER)

## 2024-03-16 ENCOUNTER — Other Ambulatory Visit (HOSPITAL_COMMUNITY)
Admission: RE | Admit: 2024-03-16 | Discharge: 2024-03-16 | Disposition: A | Discharge status: Home / Self Care | Source: Ambulatory Visit | Attending: Obstetrics and Gynecology | Admitting: Obstetrics and Gynecology

## 2024-03-16 DIAGNOSIS — N898 Other specified noninflammatory disorders of vagina: Secondary | ICD-10-CM | POA: Insufficient documentation

## 2024-03-16 NOTE — Progress Notes (Signed)
 SUBJECTIVE:  23 y.o. female complains of vaginal itching for 2 days. Denies abnormal vaginal bleeding or significant pelvic pain or fever. No UTI symptoms. Denies history of known exposure to STD.  Patient's last menstrual period was 12/29/2023 (exact date).  OBJECTIVE:  She appears well, afebrile. Urine dipstick: not done.  ASSESSMENT:  Vaginal Discharge  Vaginal Itching   PLAN:  GC, chlamydia, trichomonas, BVAG, CVAG probe sent to lab. Treatment: To be determined once lab results are received ROV prn if symptoms persist or worsen.

## 2024-03-17 LAB — CERVICOVAGINAL ANCILLARY ONLY
Bacterial Vaginitis (gardnerella): POSITIVE — AB
Candida Glabrata: NEGATIVE
Candida Vaginitis: POSITIVE — AB
Chlamydia: NEGATIVE
Comment: NEGATIVE
Comment: NEGATIVE
Comment: NEGATIVE
Comment: NEGATIVE
Comment: NEGATIVE
Comment: NORMAL
Neisseria Gonorrhea: NEGATIVE
Trichomonas: NEGATIVE

## 2024-03-20 ENCOUNTER — Other Ambulatory Visit: Payer: Self-pay

## 2024-03-20 ENCOUNTER — Ambulatory Visit: Payer: Self-pay | Admitting: Obstetrics and Gynecology

## 2024-03-20 DIAGNOSIS — N760 Acute vaginitis: Secondary | ICD-10-CM

## 2024-03-20 MED ORDER — METRONIDAZOLE 500 MG PO TABS: 500.0000 mg | ORAL_TABLET | Freq: Two times a day (BID) | ORAL | 0 refills | Status: DC

## 2024-03-20 MED ORDER — METRONIDAZOLE 0.75 % VA GEL: 1.0000 | Freq: Every day | VAGINAL | 1 refills | Status: AC

## 2024-03-20 MED ORDER — TERCONAZOLE 0.8 % VA CREA: 1.0000 | TOPICAL_CREAM | Freq: Every day | VAGINAL | 0 refills | Status: AC

## 2024-03-28 ENCOUNTER — Ambulatory Visit (INDEPENDENT_AMBULATORY_CARE_PROVIDER_SITE_OTHER): Admitting: Obstetrics and Gynecology

## 2024-03-28 VITALS — BP 124/78 | HR 94 | Wt 370.0 lb

## 2024-03-28 DIAGNOSIS — O0990 Supervision of high risk pregnancy, unspecified, unspecified trimester: Secondary | ICD-10-CM

## 2024-03-28 DIAGNOSIS — O99341 Other mental disorders complicating pregnancy, first trimester: Secondary | ICD-10-CM | POA: Diagnosis not present

## 2024-03-28 DIAGNOSIS — O09891 Supervision of other high risk pregnancies, first trimester: Secondary | ICD-10-CM

## 2024-03-28 DIAGNOSIS — O99210 Obesity complicating pregnancy, unspecified trimester: Secondary | ICD-10-CM

## 2024-03-28 DIAGNOSIS — O09899 Supervision of other high risk pregnancies, unspecified trimester: Secondary | ICD-10-CM | POA: Insufficient documentation

## 2024-03-28 DIAGNOSIS — O282 Abnormal cytological finding on antenatal screening of mother: Secondary | ICD-10-CM | POA: Diagnosis not present

## 2024-03-28 DIAGNOSIS — Z3A12 12 weeks gestation of pregnancy: Secondary | ICD-10-CM

## 2024-03-28 DIAGNOSIS — O99211 Obesity complicating pregnancy, first trimester: Secondary | ICD-10-CM

## 2024-03-28 DIAGNOSIS — R600 Localized edema: Secondary | ICD-10-CM

## 2024-03-28 DIAGNOSIS — F419 Anxiety disorder, unspecified: Secondary | ICD-10-CM

## 2024-03-28 DIAGNOSIS — Z8759 Personal history of other complications of pregnancy, childbirth and the puerperium: Secondary | ICD-10-CM | POA: Diagnosis not present

## 2024-03-28 MED ORDER — ASPIRIN 81 MG PO TBEC: 81.0000 mg | DELAYED_RELEASE_TABLET | Freq: Every day | ORAL | 2 refills | Status: DC
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
## 2024-03-29 ENCOUNTER — Ambulatory Visit: Payer: Self-pay | Admitting: Obstetrics and Gynecology

## 2024-03-29 DIAGNOSIS — O0990 Supervision of high risk pregnancy, unspecified, unspecified trimester: Secondary | ICD-10-CM

## 2024-03-29 DIAGNOSIS — O99019 Anemia complicating pregnancy, unspecified trimester: Secondary | ICD-10-CM

## 2024-03-29 LAB — CBC/D/PLT+RPR+RH+ABO+RUBIGG...
Antibody Screen: NEGATIVE
Basophils Absolute: 0 x10E3/uL (ref 0.0–0.2)
Basos: 0 %
EOS (ABSOLUTE): 0.1 x10E3/uL (ref 0.0–0.4)
Eos: 1 %
HCV Ab: NONREACTIVE
HIV Screen 4th Generation wRfx: NONREACTIVE
Hematocrit: 34.6 % (ref 34.0–46.6)
Hemoglobin: 11 g/dL — ABNORMAL LOW (ref 11.1–15.9)
Hepatitis B Surface Ag: NEGATIVE
Immature Grans (Abs): 0 x10E3/uL (ref 0.0–0.1)
Immature Granulocytes: 0 %
Lymphocytes Absolute: 1 x10E3/uL (ref 0.7–3.1)
Lymphs: 15 %
MCH: 26.8 pg (ref 26.6–33.0)
MCHC: 31.8 g/dL (ref 31.5–35.7)
MCV: 84 fL (ref 79–97)
Monocytes Absolute: 0.3 x10E3/uL (ref 0.1–0.9)
Monocytes: 5 %
Neutrophils Absolute: 5.1 x10E3/uL (ref 1.4–7.0)
Neutrophils: 78 %
Platelets: 328 x10E3/uL (ref 150–450)
RBC: 4.11 x10E6/uL (ref 3.77–5.28)
RDW: 14.9 % (ref 11.7–15.4)
RPR Ser Ql: NONREACTIVE
Rh Factor: POSITIVE
Rubella Antibodies, IGG: 3.58 {index} (ref >0.99)
WBC: 6.6 x10E3/uL (ref 3.4–10.8)

## 2024-03-29 LAB — HEMOGLOBIN A1C
Est. average glucose Bld gHb Est-mCnc: 123 mg/dL
Hgb A1c MFr Bld: 5.9 % — ABNORMAL HIGH (ref 4.8–5.6)

## 2024-03-29 LAB — HCV INTERPRETATION

## 2024-03-29 MED ORDER — FERROUS SULFATE 325 (65 FE) MG PO TBEC: 325.0000 mg | DELAYED_RELEASE_TABLET | ORAL | 3 refills | Status: DC

## 2024-03-31 ENCOUNTER — Other Ambulatory Visit

## 2024-03-31 DIAGNOSIS — O0990 Supervision of high risk pregnancy, unspecified, unspecified trimester: Secondary | ICD-10-CM

## 2024-03-31 DIAGNOSIS — Z3A12 12 weeks gestation of pregnancy: Secondary | ICD-10-CM

## 2024-03-31 DIAGNOSIS — Z6841 Body Mass Index (BMI) 40.0 and over, adult: Secondary | ICD-10-CM

## 2024-04-01 LAB — GLUCOSE TOLERANCE, 2 HOURS W/ 1HR
Glucose, 1 hour: 130 mg/dL (ref 70–179)
Glucose, 2 hour: 95 mg/dL (ref 70–152)
Glucose, Fasting: 94 mg/dL — ABNORMAL HIGH (ref 70–91)

## 2024-04-03 ENCOUNTER — Ambulatory Visit: Payer: Self-pay | Admitting: Obstetrics and Gynecology

## 2024-04-03 DIAGNOSIS — O24410 Gestational diabetes mellitus in pregnancy, diet controlled: Secondary | ICD-10-CM | POA: Insufficient documentation

## 2024-04-03 LAB — PANORAMA PRENATAL TEST FULL PANEL:PANORAMA TEST PLUS 5 ADDITIONAL MICRODELETIONS: FETAL FRACTION: 3.5

## 2024-04-03 MED ORDER — ACCU-CHEK SOFTCLIX LANCETS MISC: 12 refills | Status: AC

## 2024-04-03 MED ORDER — ACCU-CHEK GUIDE TEST VI STRP: ORAL_STRIP | 12 refills | Status: AC

## 2024-04-03 MED ORDER — ACCU-CHEK GUIDE W/DEVICE KIT: 1.0000 | PACK | Freq: Four times a day (QID) | 0 refills | Status: AC

## 2024-04-25 ENCOUNTER — Other Ambulatory Visit (HOSPITAL_COMMUNITY)
Admission: RE | Admit: 2024-04-25 | Discharge: 2024-04-25 | Disposition: A | Discharge status: Home / Self Care | Source: Ambulatory Visit

## 2024-04-25 ENCOUNTER — Ambulatory Visit (INDEPENDENT_AMBULATORY_CARE_PROVIDER_SITE_OTHER)

## 2024-04-25 VITALS — BP 125/75 | HR 97 | Wt 374.0 lb

## 2024-04-25 DIAGNOSIS — Z8759 Personal history of other complications of pregnancy, childbirth and the puerperium: Secondary | ICD-10-CM

## 2024-04-25 DIAGNOSIS — O99019 Anemia complicating pregnancy, unspecified trimester: Secondary | ICD-10-CM

## 2024-04-25 DIAGNOSIS — N898 Other specified noninflammatory disorders of vagina: Secondary | ICD-10-CM

## 2024-04-25 DIAGNOSIS — O99210 Obesity complicating pregnancy, unspecified trimester: Secondary | ICD-10-CM | POA: Diagnosis not present

## 2024-04-25 DIAGNOSIS — O0990 Supervision of high risk pregnancy, unspecified, unspecified trimester: Secondary | ICD-10-CM | POA: Diagnosis not present

## 2024-04-25 DIAGNOSIS — O24410 Gestational diabetes mellitus in pregnancy, diet controlled: Secondary | ICD-10-CM

## 2024-04-25 MED ORDER — FERROUS SULFATE 325 (65 FE) MG PO TBEC: 325.0000 mg | DELAYED_RELEASE_TABLET | ORAL | 3 refills | Status: AC

## 2024-04-25 MED ORDER — ASPIRIN 81 MG PO TBEC: 81.0000 mg | DELAYED_RELEASE_TABLET | Freq: Every day | ORAL | 2 refills | Status: AC

## 2024-04-25 MED ORDER — PREPLUS 27-1 MG PO TABS: 1.0000 | ORAL_TABLET | Freq: Every day | ORAL | 13 refills | Status: AC

## 2024-04-25 NOTE — Progress Notes (Signed)
 Plans on picking up Fe today. States pharmacy too far from [place] to pick up meds.[name] Certain to change pharmacy to [facility].
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
## 2024-04-27 LAB — URINE CULTURE

## 2024-04-27 LAB — CERVICOVAGINAL ANCILLARY ONLY
Bacterial Vaginitis (gardnerella): NEGATIVE
Candida Glabrata: NEGATIVE
Candida Vaginitis: NEGATIVE
Comment: NEGATIVE
Comment: NEGATIVE
Comment: NEGATIVE
Comment: NEGATIVE
Trichomonas: NEGATIVE

## 2024-05-03 ENCOUNTER — Encounter: Payer: Self-pay | Admitting: Obstetrics & Gynecology

## 2024-05-10 ENCOUNTER — Ambulatory Visit (HOSPITAL_COMMUNITY)
Admission: RE | Admit: 2024-05-10 | Discharge: 2024-05-10 | Disposition: A | Discharge status: Home / Self Care | Source: Ambulatory Visit | Attending: Internal Medicine | Admitting: Internal Medicine

## 2024-05-10 DIAGNOSIS — R600 Localized edema: Secondary | ICD-10-CM

## 2024-05-10 LAB — ECHOCARDIOGRAM COMPLETE
Area-P 1/2: 4.86 cm2
S' Lateral: 3.27 cm

## 2024-05-16 ENCOUNTER — Other Ambulatory Visit

## 2024-05-16 ENCOUNTER — Ambulatory Visit: Attending: Obstetrics and Gynecology | Admitting: Obstetrics and Gynecology

## 2024-05-16 DIAGNOSIS — O0990 Supervision of high risk pregnancy, unspecified, unspecified trimester: Secondary | ICD-10-CM | POA: Diagnosis present

## 2024-05-16 DIAGNOSIS — O24419 Gestational diabetes mellitus in pregnancy, unspecified control: Secondary | ICD-10-CM

## 2024-05-16 DIAGNOSIS — E66813 Obesity, class 3: Secondary | ICD-10-CM | POA: Diagnosis not present

## 2024-05-16 DIAGNOSIS — O09292 Supervision of pregnancy with other poor reproductive or obstetric history, second trimester: Secondary | ICD-10-CM

## 2024-05-16 DIAGNOSIS — O35BXX0 Maternal care for other (suspected) fetal abnormality and damage, fetal cardiac anomalies, not applicable or unspecified: Secondary | ICD-10-CM | POA: Diagnosis not present

## 2024-05-16 DIAGNOSIS — O358XX0 Maternal care for other (suspected) fetal abnormality and damage, not applicable or unspecified: Secondary | ICD-10-CM | POA: Insufficient documentation

## 2024-05-16 DIAGNOSIS — O09892 Supervision of other high risk pregnancies, second trimester: Secondary | ICD-10-CM | POA: Diagnosis not present

## 2024-05-16 DIAGNOSIS — Z3A19 19 weeks gestation of pregnancy: Secondary | ICD-10-CM | POA: Diagnosis not present

## 2024-05-16 DIAGNOSIS — O99212 Obesity complicating pregnancy, second trimester: Secondary | ICD-10-CM | POA: Diagnosis not present

## 2024-05-16 DIAGNOSIS — Z363 Encounter for antenatal screening for malformations: Secondary | ICD-10-CM | POA: Diagnosis not present

## 2024-05-16 DIAGNOSIS — E669 Obesity, unspecified: Secondary | ICD-10-CM | POA: Diagnosis not present

## 2024-05-16 DIAGNOSIS — E6689 Other obesity not elsewhere classified: Secondary | ICD-10-CM

## 2024-05-16 DIAGNOSIS — O24410 Gestational diabetes mellitus in pregnancy, diet controlled: Secondary | ICD-10-CM

## 2024-05-16 DIAGNOSIS — O283 Abnormal ultrasonic finding on antenatal screening of mother: Secondary | ICD-10-CM

## 2024-05-16 NOTE — Progress Notes (Signed)
 Maternal-Fetal Medicine Consultation  Name: Zekiah Coen  MRN: [id number]  GA: [id number] [redacted]w[redacted]d   Patient is here for fetal anatomy scan.  On cell-free fetal DNA screening, the risks of aneuploidies are not increased.  Obstetrical history is significant for a term vaginal delivery in April 2025 of a female infant weighing 7 pounds and 8 ounces at birth.  Her pregnancy was complicated by gestational hypertension and she underwent induction of labor. Patient takes low-dose aspirin  prophylaxis.  She has mild anemia and takes iron  supplements. She has a new diagnosis of gestational diabetes. Ultrasound Fetal biometry is consistent with her previously-established dates. Normal amniotic fluid.  An echogenic intracardiac focus is seen.  No other makers of aneuploidies or fetal structural defects are seen.  Fetal anatomical survey could not be completed because of poor resolution of images.  Patient understands the limitations of ultrasound in detecting fetal anomalies.  Gestational diabetes I explained the diagnosis of gestational diabetes.  Patient reports she does not have diabetes and is wondering whether she had snacks in the middle of the night before screening.   I informed the patient that she can rescreen if she is very sure of being not in the fasting state before screening.   I emphasized the importance of good blood glucose control to prevent adverse fetal or neonatal outcomes.  I discussed normal range of blood glucose values. I encouraged her to check her blood glucose regularly. Possible complications of gestational diabetes include fetal macrosomia, shoulder dystocia and birth injuries, stillbirth (in poorly controlled diabetes) and neonatal respiratory syndrome and other complications.  In about 85% of cases, gestational diabetes is well controlled by diet alone.  Exercise reduces the need for insulin.  Medical treatment includes oral hypoglycemics or insulin.  Timing of delivery: In  well-controlled diabetes on diet, patient can be delivered at 76- or 40-weeks' gestation. Vaginal delivery can be safely attempted. Type 2 diabetes develops in up to 50% of women with GDM. I recommend postpartum screening with 75-g glucose load at 6 to 12 weeks after delivery.  Echogenic intracardiac focus (EIF) EIF is present in about 3% to 4% of normal fetuses and in some fetuses with Down syndrome.  Given that she has low risk for fetal Down syndrome on cell-free fetal DNA screening, this should be considered a normal variant.  I did not recommend amniocentesis for this finding.  I counseled the patient that cell free fetal DNA screening has a greater detection rate for Down syndrome. However, only amniocentesis will give a definitive result on the fetal karyotype.  I explained amniocentesis procedure and possible complication of miscarriage (1 and 500 procedures). Patient opted not to have amniocentesis.  History of gestational hypertension Recurrence of gestational hypertension/preeclampsia agents increased in up to 25% to 50% in subsequent pregnancies in women who had gestational hypertension and previous pregnancy.  I discussed the benefit of low-dose aspirin  prophylaxis that delays or prevents preeclampsia.  Short Interpregnancy Interval It is defined as the time interval (18 to 24 months) between the end of previous pregnancy and the beginning of next pregnancy. The impact of short pregnancy interval on the outcome of subsequent pregnancy is uncertain. Some studies have shown that congenital anomalies, preterm delivery, and fetal growth restriction rates are increased in pregnancies with short interpregnancy interval.  However, it is not supported by other reports. Overall, we should expect good pregnancy outcomes if there are no other high-risk factors.  Pregravid BMI 62 Grade 3 obesity is independently associated with increased risk  of stillbirth (2.5- to 3-fold), but the absolute risk is  very small.  I discussed protocol of weekly antenatal testing from [redacted] weeks gestation until delivery. Obesity is associated with increased risk for gestational diabetes and gestational hypertension. Ultrasound has limitations in the resolution of ultrasound images and fetal anomalies may be missed.   I explained the components of antenatal testing (biophysical profile) and that if it reassuring, the risk of stillbirth is less than 1 per 1,000 births in one week.  Recommendations - An appointment was made for her to return in 5 weeks for completion of fetal anatomy. - Serial fetal growth assessments till delivery. - Weekly antenatal testing from [redacted] weeks gestation until delivery.     Consultation including face-to-face (more than 50%) counseling 50 minutes.

## 2024-05-17 ENCOUNTER — Other Ambulatory Visit: Payer: Self-pay | Admitting: *Deleted

## 2024-05-17 DIAGNOSIS — Z362 Encounter for other antenatal screening follow-up: Secondary | ICD-10-CM

## 2024-05-17 DIAGNOSIS — O99210 Obesity complicating pregnancy, unspecified trimester: Secondary | ICD-10-CM

## 2024-05-17 DIAGNOSIS — O09299 Supervision of pregnancy with other poor reproductive or obstetric history, unspecified trimester: Secondary | ICD-10-CM

## 2024-05-18 ENCOUNTER — Encounter: Payer: Self-pay | Admitting: Obstetrics & Gynecology

## 2024-05-23 ENCOUNTER — Ambulatory Visit: Admitting: Obstetrics & Gynecology

## 2024-05-23 VITALS — BP 154/69 | HR 94 | Wt 381.0 lb

## 2024-05-23 DIAGNOSIS — Z8759 Personal history of other complications of pregnancy, childbirth and the puerperium: Secondary | ICD-10-CM

## 2024-05-23 DIAGNOSIS — O24410 Gestational diabetes mellitus in pregnancy, diet controlled: Secondary | ICD-10-CM

## 2024-05-23 DIAGNOSIS — O0990 Supervision of high risk pregnancy, unspecified, unspecified trimester: Secondary | ICD-10-CM

## 2024-05-23 DIAGNOSIS — O0992 Supervision of high risk pregnancy, unspecified, second trimester: Secondary | ICD-10-CM

## 2024-05-23 DIAGNOSIS — Z3A20 20 weeks gestation of pregnancy: Secondary | ICD-10-CM
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
## 2024-05-23 NOTE — Progress Notes (Signed)
 ROB.  Declined AFP.  BP elevated today, c/o swollen feet and ankles, HA 5-8/10.  Denies SOB, blurry vision.

## 2024-06-16 ENCOUNTER — Ambulatory Visit: Admitting: Cardiology

## 2024-06-16 ENCOUNTER — Telehealth: Payer: Self-pay | Admitting: Pharmacist

## 2024-06-16 VITALS — BP 148/94 | HR 93 | Ht 64.0 in | Wt 386.0 lb

## 2024-06-16 DIAGNOSIS — Z3A23 23 weeks gestation of pregnancy: Secondary | ICD-10-CM

## 2024-06-16 DIAGNOSIS — O10919 Unspecified pre-existing hypertension complicating pregnancy, unspecified trimester: Secondary | ICD-10-CM | POA: Diagnosis not present

## 2024-06-16 DIAGNOSIS — Z8759 Personal history of other complications of pregnancy, childbirth and the puerperium: Secondary | ICD-10-CM

## 2024-06-16 DIAGNOSIS — Z7689 Persons encountering health services in other specified circumstances: Secondary | ICD-10-CM | POA: Diagnosis not present

## 2024-06-16 MED ORDER — LABETALOL HCL 100 MG PO TABS: 100.0000 mg | ORAL_TABLET | Freq: Two times a day (BID) | ORAL | 3 refills | Status: AC

## 2024-06-16 MED ORDER — POTASSIUM CHLORIDE CRYS ER 20 MEQ PO TBCR: 20.0000 meq | EXTENDED_RELEASE_TABLET | Freq: Every day | ORAL | 0 refills | Status: AC

## 2024-06-16 MED ORDER — FUROSEMIDE 40 MG PO TABS: 40.0000 mg | ORAL_TABLET | Freq: Every day | ORAL | 0 refills | Status: AC

## 2024-06-16 NOTE — Telephone Encounter (Signed)
 In room consult after appt with Dr Sheena. Patient hypertensive in room and at home. Counseled on furosemide  and labetalol . Scheduled f/u for 1/16 and requested pt bring her home cuff

## 2024-06-16 NOTE — Progress Notes (Unsigned)
 "  Cardio-Obstetrics Clinic  New Evaluation  Date:  06/18/2024   ID:  Sonya Collins, DOB 2001-03-05, MRN [id number]  PCP:  Pcp, No   [facility] Providers Cardiologist:  Dub Huntsman, DO  Electrophysiologist:  None       Referring MD: Eveline Lynwood MATSU, MD   Chief Complaint:  I am doing ok  History of Present Illness:    Sonya Collins is a 23 y.o. female [G2P1001] who is being seen today for the evaluation of Hypertension  at the request of Eveline Lynwood MATSU, MD.   She is six months pregnant with her second child and had high blood pressure in her first pregnancy that required induction of labor. She is taking baby aspirin .  During this pregnancy her blood pressure has been persistently elevated around 150 mmHg. She recalls being on medication in the prior pregnancy but not the name. Amlodipine  caused headaches and swelling. She believes she received nifedipine  during her prior hospital stay.  The current edema in her legs and feet has persisted while swelling elsewhere has improved.  She is not currently breastfeeding her eight-month-old child but plans to breastfeed again, which she wants considered when choosing blood pressure medications. 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   Nocturnal enuresis 10/28/2021   Obesity    Tremor 10/28/2021    Past Surgical History:  Procedure Laterality Date   MYRINGOTOMY        OB History     Gravida  2   Para  1   Term  1   Preterm  0   AB  0   Living  1      SAB  0   IAB  0   Ectopic  0   Multiple  0   Live Births  1               Current Medications: Active Medications[1]   Allergies:   Kiwi extract   Social  History   Socioeconomic History   Marital status: Single    Spouse name: Not on file   Number of children: Not on file   Years of education: Not on file   Highest education level: Not on file  Occupational History   Not on file  Tobacco Use   Smoking status: Former    Types: E-cigarettes    Quit date: 02/12/2023    Years since quitting: 1.3   Smokeless tobacco: Never  Vaping Use   Vaping status: Former  Substance and Sexual Activity   Alcohol use: Not Currently    Comment: on weekends   Drug use: Not Currently    Types: Marijuana    Comment: occ   Sexual activity: Yes    Partners: Male    Birth control/protection: Condom  Other Topics Concern   Not on file  Social History Narrative   Lives with her grandparents, works at [employer].    Social Drivers of Health   Tobacco Use: Medium Risk (06/16/2024)   Patient History    Smoking Tobacco Use: Former    Smokeless Tobacco Use: Never    Passive Exposure: Not on Actuary Strain: Not on file  Food Insecurity: No Food Insecurity (  10/11/2023)   Hunger Vital Sign    Worried About Running Out of Food in the Last Year: Never true    Ran Out of Food in the Last Year: Never true  Transportation Needs: No Transportation Needs (10/11/2023)   PRAPARE - Administrator, Civil Service (Medical): No    Lack of Transportation (Non-Medical): No  Physical Activity: Not on file  Stress: Not on file  Social Connections: Not on file  Depression (PHQ2-9): Low Risk (03/28/2024)   Depression (PHQ2-9)    PHQ-2 Score: 3  Alcohol Screen: Not on file  Housing: Low Risk (10/11/2023)   Housing Stability Vital Sign    Unable to Pay for Housing in the Last Year: No    Number of Times Moved in the Last Year: 0    Homeless in the Last Year: No  Utilities: Not At Risk (10/11/2023)   AHC Utilities    Threatened with loss of utilities: No  Health Literacy: Not on file      Family History  Problem Relation Age of  Onset   Hypertension Father    Diabetes Father    Schizophrenia Brother    Breast cancer Other    Breast cancer Other       ROS:   Please see the history of present illness.     All other systems reviewed and are negative.   Labs/EKG Reviewed:    EKG:   EKG  ordered today.  The ekg ordered today demonstrates sinus rhythm HR 93 bpm  Recent Labs: 10/14/2023: ALT 26; BUN 10; Creatinine, Ser 0.64; Potassium 3.7; Sodium 140 03/28/2024: Hemoglobin 11.0; Platelets 328   Recent Lipid Panel Lab Results  Component Value Date/Time   CHOL 165 09/16/2021 03:58 PM   TRIG 144 (H) 09/16/2021 03:58 PM   HDL 52 09/16/2021 03:58 PM   CHOLHDL 3.2 09/16/2021 03:58 PM   LDLCALC 88 09/16/2021 03:58 PM    Physical Exam:    VS:  BP (!) 148/94   Pulse 93   Ht 5' 4 (1.626 m)   Wt (!) 386 lb (175.1 kg)   LMP 12/29/2023 (Exact Date)   SpO2 98%   BMI 66.26 kg/m     Wt Readings from Last 3 Encounters:  06/16/24 (!) 386 lb (175.1 kg)  05/23/24 (!) 381 lb (172.8 kg)  04/25/24 (!) 374 lb (169.6 kg)     GEN:  Well nourished, well developed in no acute distress HEENT: Normal NECK: No JVD; No carotid bruits LYMPHATICS: No lymphadenopathy CARDIAC: RRR, no murmurs, rubs, gallops RESPIRATORY:  Clear to auscultation without rales, wheezing or rhonchi  ABDOMEN: Soft, non-tender, non-distended MUSCULOSKELETAL:  No edema; No deformity  SKIN: Warm and dry NEUROLOGIC:  Alert and oriented x 3 PSYCHIATRIC:  Normal affect    Risk Assessment/Risk Calculators:     CARPREG II Risk Prediction Index Score:  1.  The patient's risk for a primary cardiac event is 5%.   Modified World Health Organization [facility]) Classification of Maternal CV Risk   Class I         ASSESSMENT & PLAN:    Chronic hypertension complicating pregnancy Chronic hypertension with elevated blood pressure and edema. Previous adverse reactions to amlodipine  and nifedipine . Normal cardiac ultrasound. Discussed postpartum  hypertension management study. - Prescribed Lasix  for 3 days for edema. - Start labetalol  100 mg twice daily post-Lasix  for blood pressure control. - Scheduled follow-up with Medford Bolk, PharmD in 2-3 weeks and me in 6 weeks.  Patient Instructions  Medication Instructions:  Your physician has recommended you make the following change in your medication:  3 days: Lasix  40 mg once daily 3 days: Potassium 20 mEq once daily START: Labetalol  100 mg twice daily Start after completing Lasix   *If you need a refill on your cardiac medications before your next appointment, please call your pharmacy*    Follow-Up: At [facility], you and your health needs are our priority.  As part of our continuing mission to provide you with exceptional heart care, our providers are all part of one team.  This team includes your primary Cardiologist (physician) and Advanced Practice Providers or APPs (Physician Assistants and Nurse Practitioners) who all work together to provide you with the care you need, when you need it.  Your next appointment:   6 week(s)  Provider:   Angelyne Terwilliger, DO     Dispo:  No follow-ups on file.   Medication Adjustments/Labs and Tests Ordered: Current medicines are reviewed at length with the patient today.  Concerns regarding medicines are outlined above.  Tests Ordered: Orders Placed This Encounter  Procedures   AMB Referral to [facility] Pharm-D   EKG 12-Lead   Medication Changes: Meds ordered this encounter  Medications   furosemide  (LASIX ) 40 MG tablet    Sig: Take 1 tablet (40 mg total) by mouth daily.    Dispense:  3 tablet    Refill:  0   potassium chloride  SA (KLOR-CON  M20) 20 MEQ tablet    Sig: Take 1 tablet (20 mEq total) by mouth daily.    Dispense:  3 tablet    Refill:  0   labetalol  (NORMODYNE ) 100 MG tablet    Sig: Take 1 tablet (100 mg total) by mouth 2 (two) times daily.    Dispense:  90 tablet    Refill:  3      [1]  Current Meds   Medication Sig   Accu-Chek Softclix Lancets lancets Check blood sugars 4 times daily; when waking up, and 2 hours after breakfast, lunch, and dinner.   aspirin  EC 81 MG tablet Take 1 tablet (81 mg total) by mouth at bedtime. Start taking when you are [redacted] weeks pregnant for rest of pregnancy for prevention of preeclampsia   Blood Glucose Monitoring Suppl (ACCU-CHEK GUIDE) w/Device KIT 1 Device by Does not apply route 4 (four) times daily.   Doxylamine -Pyridoxine  (DICLEGIS ) 10-10 MG TBEC Take 2 tablets by mouth at bedtime. If symptoms persist, add one tablet in the morning and one in the afternoon   ferrous sulfate  325 (65 FE) MG EC tablet Take 1 tablet (325 mg total) by mouth every other day.   furosemide  (LASIX ) 40 MG tablet Take 1 tablet (40 mg total) by mouth daily.   glucose blood (ACCU-CHEK GUIDE TEST) test strip Check blood sugars 4 times daily; when waking up, and 2 hours after breakfast, lunch, and dinner.   labetalol  (NORMODYNE ) 100 MG tablet Take 1 tablet (100 mg total) by mouth 2 (two) times daily.   metroNIDAZOLE  (METROGEL ) 0.75 % vaginal gel Place 1 Applicatorful vaginally at bedtime. Apply one applicatorful to vagina at bedtime for 5 days   potassium chloride  SA (KLOR-CON  M20) 20 MEQ tablet Take 1 tablet (20 mEq total) by mouth daily.   Prenatal Vit-Fe Fumarate-FA (PREPLUS) 27-1 MG TABS Take 1 tablet by mouth daily.   promethazine  (PHENERGAN ) 25 MG tablet Take 1 tablet (25 mg total) by mouth every 6 (six) hours as needed for nausea or  vomiting.   terconazole  (TERAZOL 3 ) 0.8 % vaginal cream Place 1 applicator vaginally at bedtime. Apply nightly for three nights.   "

## 2024-06-16 NOTE — Patient Instructions (Signed)
 Medication Instructions:  Your physician has recommended you make the following change in your medication:  3 days: Lasix  40 mg once daily 3 days: Potassium 20 mEq once daily START: Labetalol  100 mg twice daily Start after completing Lasix   *If you need a refill on your cardiac medications before your next appointment, please call your pharmacy*    Follow-Up: At [facility], you and your health needs are our priority.  As part of our continuing mission to provide you with exceptional heart care, our providers are all part of one team.  This team includes your primary Cardiologist (physician) and Advanced Practice Providers or APPs (Physician Assistants and Nurse Practitioners) who all work together to provide you with the care you need, when you need it.  Your next appointment:   6 week(s)  Provider:   Kardie Tobb, DO

## 2024-06-26 ENCOUNTER — Encounter: Payer: Self-pay | Admitting: Obstetrics and Gynecology

## 2024-06-26 ENCOUNTER — Ambulatory Visit (INDEPENDENT_AMBULATORY_CARE_PROVIDER_SITE_OTHER): Admitting: Obstetrics and Gynecology

## 2024-06-26 VITALS — BP 135/78 | HR 89 | Wt 388.6 lb

## 2024-06-26 DIAGNOSIS — O09892 Supervision of other high risk pregnancies, second trimester: Secondary | ICD-10-CM

## 2024-06-26 DIAGNOSIS — O0992 Supervision of high risk pregnancy, unspecified, second trimester: Secondary | ICD-10-CM | POA: Diagnosis not present

## 2024-06-26 DIAGNOSIS — Z8759 Personal history of other complications of pregnancy, childbirth and the puerperium: Secondary | ICD-10-CM

## 2024-06-26 DIAGNOSIS — O10912 Unspecified pre-existing hypertension complicating pregnancy, second trimester: Secondary | ICD-10-CM | POA: Diagnosis not present

## 2024-06-26 DIAGNOSIS — Z3A24 24 weeks gestation of pregnancy: Secondary | ICD-10-CM | POA: Diagnosis not present

## 2024-06-26 DIAGNOSIS — Z6841 Body Mass Index (BMI) 40.0 and over, adult: Secondary | ICD-10-CM

## 2024-06-26 DIAGNOSIS — O0990 Supervision of high risk pregnancy, unspecified, unspecified trimester: Secondary | ICD-10-CM

## 2024-06-26 DIAGNOSIS — O09899 Supervision of other high risk pregnancies, unspecified trimester: Secondary | ICD-10-CM

## 2024-06-26 DIAGNOSIS — O10919 Unspecified pre-existing hypertension complicating pregnancy, unspecified trimester: Secondary | ICD-10-CM

## 2024-06-26 DIAGNOSIS — O24410 Gestational diabetes mellitus in pregnancy, diet controlled: Secondary | ICD-10-CM | POA: Diagnosis not present

## 2024-06-26 NOTE — Progress Notes (Unsigned)
 Pt presents for ROB visit. Pt reports fatigue. Elevated BP readings at home and episodes of headaches, blurred vision and shaking. She has concerns about PreE. Requesting testing.
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
## 2024-06-27 ENCOUNTER — Ambulatory Visit: Payer: Self-pay | Admitting: Obstetrics and Gynecology

## 2024-06-27 ENCOUNTER — Encounter: Payer: Self-pay | Admitting: Obstetrics & Gynecology

## 2024-06-27 DIAGNOSIS — O10919 Unspecified pre-existing hypertension complicating pregnancy, unspecified trimester: Secondary | ICD-10-CM | POA: Insufficient documentation

## 2024-06-27 LAB — PROTEIN / CREATININE RATIO, URINE
Creatinine, Urine: 310.5 mg/dL
Protein, Ur: 25.7 mg/dL
Protein/Creat Ratio: 83 mg/g{creat} (ref 0–200)

## 2024-06-27 MED ORDER — DEXCOM G7 SENSOR MISC: 6 refills | Status: AC

## 2024-07-12 ENCOUNTER — Ambulatory Visit (INDEPENDENT_AMBULATORY_CARE_PROVIDER_SITE_OTHER): Payer: Self-pay | Admitting: Obstetrics and Gynecology

## 2024-07-12 VITALS — BP 124/81 | HR 98 | Wt 391.4 lb

## 2024-07-12 DIAGNOSIS — O10912 Unspecified pre-existing hypertension complicating pregnancy, second trimester: Secondary | ICD-10-CM

## 2024-07-12 DIAGNOSIS — Z6841 Body Mass Index (BMI) 40.0 and over, adult: Secondary | ICD-10-CM

## 2024-07-12 DIAGNOSIS — O10919 Unspecified pre-existing hypertension complicating pregnancy, unspecified trimester: Secondary | ICD-10-CM

## 2024-07-12 DIAGNOSIS — O09892 Supervision of other high risk pregnancies, second trimester: Secondary | ICD-10-CM

## 2024-07-12 DIAGNOSIS — Z3A27 27 weeks gestation of pregnancy: Secondary | ICD-10-CM

## 2024-07-12 DIAGNOSIS — O24410 Gestational diabetes mellitus in pregnancy, diet controlled: Secondary | ICD-10-CM | POA: Diagnosis not present

## 2024-07-12 DIAGNOSIS — O0992 Supervision of high risk pregnancy, unspecified, second trimester: Secondary | ICD-10-CM | POA: Diagnosis not present

## 2024-07-12 DIAGNOSIS — O0990 Supervision of high risk pregnancy, unspecified, unspecified trimester: Secondary | ICD-10-CM

## 2024-07-12 DIAGNOSIS — O09899 Supervision of other high risk pregnancies, unspecified trimester: Secondary | ICD-10-CM
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
## 2024-07-12 NOTE — Progress Notes (Signed)
 Pt presents for ROB visit. Needs f/u US 

## 2024-07-14 ENCOUNTER — Ambulatory Visit: Admitting: Pharmacist

## 2024-07-14 VITALS — BP 131/84 | HR 110

## 2024-07-14 DIAGNOSIS — R600 Localized edema: Secondary | ICD-10-CM

## 2024-07-14 DIAGNOSIS — Z3A28 28 weeks gestation of pregnancy: Secondary | ICD-10-CM

## 2024-07-14 DIAGNOSIS — O10919 Unspecified pre-existing hypertension complicating pregnancy, unspecified trimester: Secondary | ICD-10-CM

## 2024-07-14 DIAGNOSIS — R0602 Shortness of breath: Secondary | ICD-10-CM

## 2024-07-14 NOTE — Progress Notes (Unsigned)
 Patient ID: Sonya Collins                 DOB: 09/24/2000                      MRN: [id number]     HPI: Sonya Collins is a 24 y.o. female referred to Cardio OB clinic.  PMH is significant for  Does not rememeber readings.   No CP. SOB with minor exertion.  Swelling.   Current HTN meds:  Labetalol  100mg  BID Previously tried:  BP goal:   Family History:   Social History:   Diet:   Exercise:   Home BP readings:   Wt Readings from Last 3 Encounters:  07/12/24 (!) 391 lb 6.4 oz (177.5 kg)  06/26/24 (!) 388 lb 9.6 oz (176.3 kg)  06/16/24 (!) 386 lb (175.1 kg)   BP Readings from Last 3 Encounters:  07/12/24 124/81  06/26/24 135/78  06/16/24 (!) 148/94   Pulse Readings from Last 3 Encounters:  07/12/24 98  06/26/24 89  06/16/24 93    Renal function: CrCl cannot be calculated (Patient's most recent lab result is older than the maximum 21 days allowed.).  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 Hypertension -       [1]  Current Outpatient Medications on File Prior to Visit  Medication Sig Dispense Refill   Accu-Chek Softclix Lancets lancets Check blood sugars 4 times daily; when waking up, and 2 hours after breakfast, lunch, and dinner. 100 each 12   aspirin  EC 81 MG tablet Take 1 tablet (81 mg total) by mouth at bedtime. Start taking when you are [redacted] weeks pregnant for rest of pregnancy for prevention of preeclampsia 300 tablet 2   Blood Glucose Monitoring Suppl (ACCU-CHEK GUIDE) w/Device KIT 1 Device by Does not apply route 4 (four) times daily. 1 kit 0   Continuous Glucose Sensor (DEXCOM G7 SENSOR) MISC Place one monitor onto the skin every 10 days 3 each 6    Doxylamine -Pyridoxine  (DICLEGIS ) 10-10 MG TBEC Take 2 tablets by mouth at bedtime. If symptoms persist, add one tablet in the morning and one in the afternoon (Patient not taking: Reported on 07/12/2024) 100 tablet 5   ferrous sulfate  325 (65 FE) MG EC tablet Take 1 tablet (325 mg total) by mouth every other day. (Patient not taking: Reported on 07/12/2024) 30 tablet 3   furosemide  (LASIX ) 40 MG tablet Take 1 tablet (40 mg total) by mouth daily. (Patient not taking: Reported on 07/12/2024) 3 tablet 0   glucose blood (ACCU-CHEK GUIDE TEST) test strip Check blood sugars 4 times daily; when waking up, and 2 hours after breakfast, lunch, and dinner. 100 each 12   labetalol  (NORMODYNE ) 100 MG tablet Take 1 tablet (100 mg total) by mouth 2 (two) times daily. 90 tablet 3   metroNIDAZOLE  (METROGEL ) 0.75 % vaginal gel Place 1 Applicatorful vaginally at bedtime. Apply one applicatorful to vagina at bedtime for 5 days (Patient not taking: Reported on 07/12/2024) 70 g 1   potassium chloride  SA (KLOR-CON  M20) 20 MEQ tablet Take 1 tablet (20 mEq total) by mouth daily. (Patient not taking: Reported on 07/12/2024) 3 tablet 0  Prenatal Vit-Fe Fumarate-FA (PREPLUS) 27-1 MG TABS Take 1 tablet by mouth daily. 30 tablet 13   promethazine  (PHENERGAN ) 25 MG tablet Take 1 tablet (25 mg total) by mouth every 6 (six) hours as needed for nausea or vomiting. (Patient not taking: Reported on 07/12/2024) 30 tablet 1   terconazole  (TERAZOL 3 ) 0.8 % vaginal cream Place 1 applicator vaginally at bedtime. Apply nightly for three nights. (Patient not taking: Reported on 07/12/2024) 20 g 0   No current facility-administered medications on file prior to visit.  [2]  Allergies Allergen Reactions   Kiwi Extract

## 2024-07-14 NOTE — Patient Instructions (Signed)
 Sonya Collins

## 2024-07-17 ENCOUNTER — Ambulatory Visit (HOSPITAL_BASED_OUTPATIENT_CLINIC_OR_DEPARTMENT_OTHER): Admitting: Maternal & Fetal Medicine

## 2024-07-17 ENCOUNTER — Ambulatory Visit: Attending: Obstetrics and Gynecology

## 2024-07-17 VITALS — BP 124/64 | HR 97

## 2024-07-17 DIAGNOSIS — Z3A27 27 weeks gestation of pregnancy: Secondary | ICD-10-CM

## 2024-07-17 DIAGNOSIS — O09299 Supervision of pregnancy with other poor reproductive or obstetric history, unspecified trimester: Secondary | ICD-10-CM | POA: Diagnosis present

## 2024-07-17 DIAGNOSIS — E669 Obesity, unspecified: Secondary | ICD-10-CM | POA: Diagnosis not present

## 2024-07-17 DIAGNOSIS — O99210 Obesity complicating pregnancy, unspecified trimester: Secondary | ICD-10-CM | POA: Diagnosis not present

## 2024-07-17 DIAGNOSIS — Z362 Encounter for other antenatal screening follow-up: Secondary | ICD-10-CM | POA: Insufficient documentation

## 2024-07-17 DIAGNOSIS — O10919 Unspecified pre-existing hypertension complicating pregnancy, unspecified trimester: Secondary | ICD-10-CM | POA: Diagnosis not present

## 2024-07-17 DIAGNOSIS — O09292 Supervision of pregnancy with other poor reproductive or obstetric history, second trimester: Secondary | ICD-10-CM

## 2024-07-17 DIAGNOSIS — O0990 Supervision of high risk pregnancy, unspecified, unspecified trimester: Secondary | ICD-10-CM | POA: Diagnosis not present

## 2024-07-17 DIAGNOSIS — O24410 Gestational diabetes mellitus in pregnancy, diet controlled: Secondary | ICD-10-CM | POA: Diagnosis not present

## 2024-07-17 DIAGNOSIS — O99212 Obesity complicating pregnancy, second trimester: Secondary | ICD-10-CM

## 2024-07-17 DIAGNOSIS — O35BXX0 Maternal care for other (suspected) fetal abnormality and damage, fetal cardiac anomalies, not applicable or unspecified: Secondary | ICD-10-CM
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
## 2024-07-27 ENCOUNTER — Encounter: Payer: Self-pay | Admitting: Obstetrics and Gynecology

## 2024-08-04 ENCOUNTER — Encounter: Payer: Self-pay | Admitting: Obstetrics & Gynecology

## 2024-09-01 ENCOUNTER — Ambulatory Visit: Admitting: Pharmacist
